# Patient Record
Sex: Male | Born: 1955
Health system: Southern US, Community
[De-identification: ages and names within clinical notes are randomized; demographics above are authoritative.]

## PROBLEM LIST (undated history)

## (undated) DIAGNOSIS — Z8619 Personal history of other infectious and parasitic diseases: Secondary | ICD-10-CM

## (undated) DIAGNOSIS — M2550 Pain in unspecified joint: Secondary | ICD-10-CM

## (undated) DIAGNOSIS — M1990 Unspecified osteoarthritis, unspecified site: Secondary | ICD-10-CM

## (undated) DIAGNOSIS — M549 Dorsalgia, unspecified: Secondary | ICD-10-CM

## (undated) DIAGNOSIS — I10 Essential (primary) hypertension: Secondary | ICD-10-CM

## (undated) DIAGNOSIS — R531 Weakness: Secondary | ICD-10-CM

## (undated) DIAGNOSIS — E785 Hyperlipidemia, unspecified: Secondary | ICD-10-CM

## (undated) DIAGNOSIS — Z8719 Personal history of other diseases of the digestive system: Secondary | ICD-10-CM

## (undated) HISTORY — PX: ELBOW SURGERY: SHX618

## (undated) HISTORY — PX: SHOULDER SURGERY: SHX246

## (undated) HISTORY — PX: TONSILLECTOMY: SUR1361

## (undated) HISTORY — PX: ADENOIDECTOMY: SUR15

## (undated) HISTORY — PX: ARTHROSCOPIC REPAIR ACL: SUR80

## (undated) HISTORY — PX: COLONOSCOPY: SHX174

---

## 1997-09-23 ENCOUNTER — Ambulatory Visit (HOSPITAL_BASED_OUTPATIENT_CLINIC_OR_DEPARTMENT_OTHER): Admission: RE | Admit: 1997-09-23 | Discharge: 1997-09-23 | Payer: Self-pay | Admitting: Orthopaedic Surgery

## 1997-12-12 ENCOUNTER — Emergency Department (HOSPITAL_COMMUNITY): Admission: EM | Admit: 1997-12-12 | Discharge: 1997-12-12 | Payer: Self-pay

## 2003-05-13 ENCOUNTER — Encounter: Admission: RE | Admit: 2003-05-13 | Discharge: 2003-05-13 | Payer: Self-pay | Admitting: Diagnostic Radiology

## 2003-07-22 ENCOUNTER — Encounter: Admission: RE | Admit: 2003-07-22 | Discharge: 2003-07-22 | Payer: Self-pay | Admitting: Diagnostic Radiology

## 2004-06-04 ENCOUNTER — Encounter: Admission: RE | Admit: 2004-06-04 | Discharge: 2004-06-04 | Payer: Self-pay | Admitting: Internal Medicine

## 2004-07-03 ENCOUNTER — Encounter: Admission: RE | Admit: 2004-07-03 | Discharge: 2004-07-03 | Payer: Self-pay | Admitting: Internal Medicine

## 2005-01-23 ENCOUNTER — Encounter: Admission: RE | Admit: 2005-01-23 | Discharge: 2005-01-23 | Payer: Self-pay | Admitting: Sports Medicine

## 2005-08-19 ENCOUNTER — Ambulatory Visit (HOSPITAL_BASED_OUTPATIENT_CLINIC_OR_DEPARTMENT_OTHER): Admission: RE | Admit: 2005-08-19 | Discharge: 2005-08-19 | Payer: Self-pay | Admitting: Family Medicine

## 2005-08-20 ENCOUNTER — Ambulatory Visit: Payer: Self-pay | Admitting: Internal Medicine

## 2007-12-31 ENCOUNTER — Emergency Department (HOSPITAL_BASED_OUTPATIENT_CLINIC_OR_DEPARTMENT_OTHER): Admission: EM | Admit: 2007-12-31 | Discharge: 2007-12-31 | Payer: Self-pay | Admitting: Emergency Medicine

## 2010-04-16 ENCOUNTER — Encounter: Payer: Self-pay | Admitting: Internal Medicine

## 2010-08-11 NOTE — Procedures (Signed)
Johnny Gonzalez, HECKMANN NO.:  [id number]   MEDICAL RECORD NO.:  [id number]          PATIENT TYPE:  OUT   LOCATION:  SLEEP CENTER                 FACILITY:  [facility]   PHYSICIAN:  Clinton D. Maple Hudson, M.D. DATE OF BIRTH:  [birth year]   DATE OF STUDY:                              NOCTURNAL POLYSOMNOGRAM   REFERRING PHYSICIAN:  Dr. Bradd Canary   DATE OF STUDY:  Aug 19, 2005   INDICATION FOR STUDY:  Insomnia with sleep apnea   EPWORTH SLEEPINESS SCORE:  1/24.  BMI 31.  Weight 235 pounds.   MEDICATIONS:  Lorazepam.   SLEEP ARCHITECTURE:  Total sleep time 347 minutes with sleep efficiency 81%.  Stage I was 6%, stage II 70%, stages III and IV were absent, REM 23% of  total sleep time.  Sleep latency 56 minutes.  REM latency 86 minutes.  Awake  after sleep onset 24 minutes.  Arousal index 24.  No bedtime medication was  reported.   RESPIRATORY DATA:  Apnea/hypopnea index (AHI, RDI) 13.1 obstructive events  per hour indicating mild obstructive sleep apnea/hypopnea syndrome.  This  included 76 hypopneas.  Events were not positional.  REM AHI 19.6 per hour.  There were insufficient events and sleep to permit use of CPAP titration by  split protocol on the study night.   OXYGEN DATA:  Moderately loud snoring with oxygen saturation nadir 88%.  Mean oxygen saturation through the study 95% on room air.   CARDIAC DATA:  Normal sinus rhythm with occasional PVC.   MOVEMENT-PARASOMNIA:  A total of 214 limb jerks of which 13 were associated  with arousal or awakening for a periodic limb movement with arousal index of  2.2 per hour which is mildly increased.   IMPRESSIONS-RECOMMENDATIONS:  1.  Sleep architecture was not remarkable for sleep center environment and      did not appear to reflect a significant insomnia pattern on the study      night despite apparent absence of sedative hypnotic medication.  2.  Mild obstructive sleep apnea/hypopnea syndrome with non-positional  events limited to hypopneas.  Moderately loud snoring with oxygen      desaturation to a nadir of 88%.  3.  Obstructive apnea with scores in this range is borderline for treatment      with CPAP.  It may be covered if more conservative measures such as      weight loss and sleeping on flat of back, as well as treatment for any      significant      nasal congestion, are insufficient or unsatisfactory.  4.  Mild periodic limb movement with arousal, 2.2 per hour.      Clinton D. Maple Hudson, M.D.  Diplomate, Biomedical engineer of Sleep Medicine  Electronically Signed     CDY/MEDQ  D:  08/20/2005 14:17:31  T:  08/20/2005 15:14:19  Job:  [id number]

## 2013-03-16 ENCOUNTER — Other Ambulatory Visit: Payer: Self-pay | Admitting: Orthopedic Surgery

## 2013-03-16 NOTE — Progress Notes (Signed)
Preoperative surgical orders have been place into the Epic hospital system for Johnny Gonzalez on 03/16/2013, 8:25 AM  by Patrica Duel for surgery on 04-01-2013.  Preop Total Hip - Anterior Approach orders including Experel Injecion, PO Tylenol, and IV Decadron as long as there are no contraindications to the above medications. Avel Peace, PA-C

## 2013-03-17 ENCOUNTER — Encounter (HOSPITAL_COMMUNITY): Payer: Self-pay | Admitting: Pharmacy Technician

## 2013-03-23 ENCOUNTER — Other Ambulatory Visit (HOSPITAL_COMMUNITY): Payer: Self-pay | Admitting: Orthopedic Surgery

## 2013-03-23 NOTE — Patient Instructions (Signed)
20 PHILEMON RIEDESEL  03/23/2013   Your procedure is scheduled on: Wednesday January 7th 2015  Report to [facility] at 1100 AM.  Call this number if you have problems the morning of surgery [phone]   Remember:   Do not eat food :After Midnight.   clear  Liquids midnight until 730 am day of surgery, then nothing by mouth   Take these medicines the morning of surgery with A SIP OF WATER:                                 SEE [place] PREPARING FOR SURGERY SHEET             You may not have any metal on your body including hair pins and piercings  Do not wear jewelry, make-up.  Do not wear lotions, powders, or perfumes. You may wear deodorant.   Men may shave face and neck.  Do not bring valuables to the hospital. [facility] IS NOT RESPONSIBLE FOR VALUEABLES.  Contacts, dentures or bridgework may not be worn into surgery.  Leave suitcase in the car. After surgery it may be brought to your room.  For patients admitted to the hospital, checkout time is 11:00 AM the day of discharge.     Please read over the following fact sheets that you were given: [facility] information, blood sheet, [place] preparing for surgery sheet , incentive spirometer sheet, clear liquid sheet   Call  Theodis Aguas  RN pre op nurse if needed [phone]    FAILURE TO FOLLOW THESE INSTRUCTIONS MAY RESULT IN THE CANCELLATION OF YOUR SURGERY.  PATIENT SIGNATURE___________________________________________  NURSE SIGNATURE_____________________________________________

## 2013-03-23 NOTE — Progress Notes (Signed)
ekg dated 12-4 14 dr Vernell Morgans on chart Medical clearance note  02-26-13 dr Vernell Morgans on chart

## 2013-03-24 ENCOUNTER — Inpatient Hospital Stay (HOSPITAL_COMMUNITY)
Admission: RE | Admit: 2013-03-24 | Discharge: 2013-03-24 | Disposition: A | Discharge status: Home / Self Care | Payer: Self-pay | Source: Ambulatory Visit

## 2013-04-01 ENCOUNTER — Encounter (HOSPITAL_COMMUNITY): Admission: RE | Payer: Self-pay | Source: Ambulatory Visit

## 2013-04-01 ENCOUNTER — Inpatient Hospital Stay (HOSPITAL_COMMUNITY): Admission: RE | Admit: 2013-04-01 | Payer: Self-pay | Source: Ambulatory Visit | Admitting: Orthopedic Surgery

## 2013-04-01 SURGERY — ARTHROPLASTY, HIP, TOTAL, ANTERIOR APPROACH
Anesthesia: Choice | Site: Hip | Laterality: Left

## 2013-04-07 ENCOUNTER — Encounter (HOSPITAL_COMMUNITY): Payer: Self-pay | Admitting: Emergency Medicine

## 2013-04-07 ENCOUNTER — Emergency Department (HOSPITAL_COMMUNITY)
Admission: EM | Admit: 2013-04-07 | Discharge: 2013-04-07 | Disposition: A | Discharge status: Home / Self Care | Payer: No Typology Code available for payment source | Source: Home / Self Care | Attending: Family Medicine | Admitting: Family Medicine

## 2013-04-07 DIAGNOSIS — M25512 Pain in left shoulder: Secondary | ICD-10-CM

## 2013-04-07 DIAGNOSIS — M25519 Pain in unspecified shoulder: Secondary | ICD-10-CM

## 2013-04-07 MED ORDER — TRIAMCINOLONE ACETONIDE 40 MG/ML IJ SUSP
INTRAMUSCULAR | Status: AC
  Filled 2013-04-07: qty 1

## 2013-04-07 NOTE — ED Notes (Signed)
Dr. Artis FlockKindl has already seen pt Pt c/o left shoulder pain onset 3 weeks that increases w/activity Hx of left shoulder surgery... Denies recent inj/trauma Alert w/no signs of acute distress.

## 2013-04-07 NOTE — ED Provider Notes (Signed)
CSN: [id number]     Arrival date & time 04/07/13  0800 History   First MD Initiated Contact with Patient 04/07/13 [phone]     Chief Complaint  Patient presents with  . Shoulder Pain   (Consider location/radiation/quality/duration/timing/severity/associated sxs/prior Treatment) Patient is a 58 y.o. male presenting with shoulder pain. The history is provided by the patient.  Shoulder Pain This is a chronic problem. The current episode started more than 2 days ago. The problem has been gradually worsening. Associated symptoms comments: Deg changes to bilat shoulders, L>R, increasing pain and limitation of rom, requesting inj for temp relief until surgery .Marland Kitchen.    History reviewed. No pertinent past medical history. Past Surgical History  Procedure Laterality Date  . Brain surgery     No family history on file. History  Substance Use Topics  . Smoking status: Never Smoker   . Smokeless tobacco: Not on file  . Alcohol Use: No    Review of Systems  Musculoskeletal: Positive for arthralgias and joint swelling.    Allergies  Review of patient's allergies indicates no known allergies.  Home Medications   Current Outpatient Rx  Name  Route  Sig  Dispense  Refill  . valsartan-hydrochlorothiazide (DIOVAN-HCT) 160-25 MG per tablet   Oral   Take 1 tablet by mouth daily with breakfast.         . CALCIUM PO   Oral   Take 1 tablet by mouth daily.         Chilton Si. Green Tea, Camillia sinensis, (GREEN TEA PO)   Oral   Take 1 tablet by mouth daily.         . Naproxen Sodium (ALEVE PO)   Oral   Take 2 tablets by mouth daily as needed (pain).          BP 133/88  Pulse 72  Temp(Src) 97.3 F (36.3 C) (Oral)  Resp 18  SpO2 98% Physical Exam  Nursing note and vitals reviewed. Constitutional: He is oriented to person, place, and time. He appears well-developed and well-nourished.  Musculoskeletal: He exhibits tenderness.       Left shoulder: He exhibits decreased range of motion,  tenderness, bony tenderness, crepitus, pain, abnormal pulse and decreased strength.  Neurological: He is alert and oriented to person, place, and time.  Skin: Skin is warm and dry.    ED Course  ARTHOCENTESIS Date/Time: 04/07/2013 8:42 AM Performed by: Linna HoffKINDL, Shields Pautz D Authorized by: Bradd CanaryKINDL, Crysta Gulick D Consent: Verbal consent obtained. Risks and benefits: risks, benefits and alternatives were discussed Consent given by: patient Indications: pain  Body area: shoulder Joint: left shoulder Local anesthesia used: no Patient sedated: no Needle gauge: 22 G Approach: lateral Bupivacaine 0.50% amount: 5 ml Hydrocortisone amount: 40 mg Patient tolerance: Patient tolerated the procedure well with no immediate complications. Comments: Marked improvement felt after inj.   (including critical care time) Labs Review Labs Reviewed - No data to display Imaging Review No results found.  EKG Interpretation    Date/Time:    Ventricular Rate:    PR Interval:    QRS Duration:   QT Interval:    QTC Calculation:   R Axis:     Text Interpretation:              MDM   1. Shoulder joint pain, left        Linna HoffJames D Richa Shor, MD 04/07/13 [phone]

## 2013-06-02 ENCOUNTER — Emergency Department (HOSPITAL_COMMUNITY)
Admission: EM | Admit: 2013-06-02 | Discharge: 2013-06-02 | Disposition: A | Discharge status: Home / Self Care | Payer: No Typology Code available for payment source | Source: Home / Self Care | Attending: Family Medicine | Admitting: Family Medicine

## 2013-06-02 ENCOUNTER — Encounter (HOSPITAL_COMMUNITY): Payer: Self-pay | Admitting: Emergency Medicine

## 2013-06-02 DIAGNOSIS — M7500 Adhesive capsulitis of unspecified shoulder: Secondary | ICD-10-CM

## 2013-06-02 DIAGNOSIS — M7501 Adhesive capsulitis of right shoulder: Secondary | ICD-10-CM

## 2013-06-02 MED ORDER — TRIAMCINOLONE ACETONIDE 40 MG/ML IJ SUSP
80.0000 mg | Freq: Once | INTRAMUSCULAR | Status: AC
  Administered 2013-06-02: 80 mg via INTRA_ARTICULAR

## 2013-06-02 MED ORDER — DICLOFENAC 35 MG PO CAPS: 35.0000 mg | ORAL_CAPSULE | Freq: Three times a day (TID) | ORAL | Status: DC

## 2013-06-02 MED ORDER — TRIAMCINOLONE ACETONIDE 40 MG/ML IJ SUSP
INTRAMUSCULAR | Status: AC
  Filled 2013-06-02: qty 2

## 2013-06-02 NOTE — Discharge Instructions (Signed)
Ice and medicine as needed, return as needed

## 2013-06-02 NOTE — ED Notes (Signed)
80  Mg  Kenalog  Given to  Dr Artis Flockkindl     For  Injection   Mixed  With 3ml  Marcaine

## 2013-06-02 NOTE — ED Notes (Signed)
Shoulder  Pain   Has had  Before         Pt  denys  Any  Recent injury     -  Pt  Ambulated to  Room  With a  Steady  Fluid  Gait

## 2013-06-02 NOTE — ED Provider Notes (Signed)
CSN: [id number]     Arrival date & time 06/02/13  0801 History   First MD Initiated Contact with Patient 06/02/13 0830     Chief Complaint  Patient presents with  . Shoulder Pain   (Consider location/radiation/quality/duration/timing/severity/associated sxs/prior Treatment) Patient is a 58 y.o. male presenting with shoulder pain. The history is provided by the patient.  Shoulder Pain This is a chronic problem. The current episode started 2 days ago (desires inj until surgery with dr Thurston Holewainer, having problems with both shoulders, injection helped left.). The problem has been gradually worsening. Pertinent negatives include no chest pain and no abdominal pain.    History reviewed. No pertinent past medical history. Past Surgical History  Procedure Laterality Date  . Brain surgery     History reviewed. No pertinent family history. History  Substance Use Topics  . Smoking status: Never Smoker   . Smokeless tobacco: Not on file  . Alcohol Use: No    Review of Systems  Constitutional: Negative.   Cardiovascular: Negative for chest pain.  Gastrointestinal: Negative for abdominal pain.  Musculoskeletal: Positive for joint swelling.    Allergies  Review of patient's allergies indicates no known allergies.  Home Medications   Current Outpatient Rx  Name  Route  Sig  Dispense  Refill  . CALCIUM PO   Oral   Take 1 tablet by mouth daily.         . Diclofenac (ZORVOLEX) 35 MG CAPS   Oral   Take 35 mg by mouth 3 (three) times daily after meals.   90 capsule   1   . Green Tea, Camillia sinensis, (GREEN TEA PO)   Oral   Take 1 tablet by mouth daily.         . Naproxen Sodium (ALEVE PO)   Oral   Take 2 tablets by mouth daily as needed (pain).         . valsartan-hydrochlorothiazide (DIOVAN-HCT) 160-25 MG per tablet   Oral   Take 1 tablet by mouth daily with breakfast.          BP 130/90  Pulse 88  Temp(Src) 98.6 F (37 C) (Oral)  Resp 18  SpO2 100% Physical  Exam  Nursing note and vitals reviewed. Constitutional: He is oriented to person, place, and time. He appears well-developed and well-nourished.  Musculoskeletal: He exhibits tenderness.       Right shoulder: He exhibits decreased range of motion, bony tenderness, swelling, crepitus, pain and decreased strength. He exhibits no deformity and normal pulse.  Neurological: He is alert and oriented to person, place, and time.  Skin: Skin is warm and dry.    ED Course  ARTHOCENTESIS Date/Time: 06/02/2013 8:45 AM Performed by: Linna HoffKINDL, Teairra Millar D Authorized by: Bradd CanaryKINDL, Tahsin Benyo D Consent: Verbal consent obtained. Risks and benefits: risks, benefits and alternatives were discussed Consent given by: patient Indications: pain  Body area: shoulder Joint: right shoulder Local anesthesia used: no Patient sedated: no Preparation: Patient was prepped and draped in the usual sterile fashion. Needle gauge: 22 G Ultrasound guidance: no Approach: lateral Triamcinolone amount: 80 mg Bupivacaine 0.50% amount: 3 ml Patient tolerance: Patient tolerated the procedure well with no immediate complications.   (including critical care time) Labs Review Labs Reviewed - No data to display Imaging Review No results found.   MDM   1. Bursitis of shoulder, right, adhesive        Linna HoffJames D Damyen Knoll, MD 06/02/13 [phone]

## 2013-08-28 ENCOUNTER — Other Ambulatory Visit: Payer: Self-pay | Admitting: Orthopedic Surgery

## 2013-09-04 ENCOUNTER — Encounter (HOSPITAL_COMMUNITY): Payer: Self-pay | Admitting: Pharmacy Technician

## 2013-09-08 ENCOUNTER — Encounter (HOSPITAL_COMMUNITY): Payer: Self-pay

## 2013-09-08 ENCOUNTER — Other Ambulatory Visit: Payer: Self-pay

## 2013-09-08 ENCOUNTER — Encounter (HOSPITAL_COMMUNITY)
Admission: RE | Admit: 2013-09-08 | Discharge: 2013-09-08 | Disposition: A | Discharge status: Home / Self Care | Payer: No Typology Code available for payment source | Source: Ambulatory Visit | Attending: Orthopedic Surgery | Admitting: Orthopedic Surgery

## 2013-09-08 DIAGNOSIS — Z01812 Encounter for preprocedural laboratory examination: Secondary | ICD-10-CM | POA: Insufficient documentation

## 2013-09-08 DIAGNOSIS — Z01818 Encounter for other preprocedural examination: Secondary | ICD-10-CM | POA: Diagnosis not present

## 2013-09-08 HISTORY — DX: Weakness: R53.1

## 2013-09-08 HISTORY — DX: Personal history of other diseases of the digestive system: Z87.19

## 2013-09-08 HISTORY — DX: Unspecified osteoarthritis, unspecified site: M19.90

## 2013-09-08 HISTORY — DX: Personal history of other infectious and parasitic diseases: Z86.19

## 2013-09-08 HISTORY — DX: Pain in unspecified joint: M25.50

## 2013-09-08 HISTORY — DX: Dorsalgia, unspecified: M54.9

## 2013-09-08 HISTORY — DX: Essential (primary) hypertension: I10

## 2013-09-08 HISTORY — DX: Hyperlipidemia, unspecified: E78.5

## 2013-09-08 LAB — CBC
HCT: 39.1 % (ref 39.0–52.0)
Hemoglobin: 12.8 g/dL — ABNORMAL LOW (ref 13.0–17.0)
MCH: 28.5 pg (ref 26.0–34.0)
MCHC: 32.7 g/dL (ref 30.0–36.0)
MCV: 87.1 fL (ref 78.0–100.0)
PLATELETS: 207 10*3/uL (ref 150–400)
RBC: 4.49 MIL/uL (ref 4.22–5.81)
RDW: 13.6 % (ref 11.5–15.5)
WBC: 7.1 10*3/uL (ref 4.0–10.5)

## 2013-09-08 LAB — COMPREHENSIVE METABOLIC PANEL
ALK PHOS: 78 U/L (ref 39–117)
ALT: 26 U/L (ref 0–53)
AST: 22 U/L (ref 0–37)
Albumin: 4.1 g/dL (ref 3.5–5.2)
BILIRUBIN TOTAL: 0.5 mg/dL (ref 0.3–1.2)
BUN: 18 mg/dL (ref 6–23)
CHLORIDE: 99 meq/L (ref 96–112)
CO2: 32 mEq/L (ref 19–32)
Calcium: 9.1 mg/dL (ref 8.4–10.5)
Creatinine, Ser: 1.26 mg/dL (ref 0.50–1.35)
GFR calc Af Amer: 70 mL/min — ABNORMAL LOW (ref >90)
GFR calc non Af Amer: 61 mL/min — ABNORMAL LOW (ref >90)
Glucose, Bld: 93 mg/dL (ref 70–99)
POTASSIUM: 3.7 meq/L (ref 3.7–5.3)
Sodium: 142 mEq/L (ref 137–147)
Total Protein: 7.3 g/dL (ref 6.0–8.3)

## 2013-09-08 LAB — URINALYSIS, ROUTINE W REFLEX MICROSCOPIC
Bilirubin Urine: NEGATIVE
Glucose, UA: NEGATIVE mg/dL
Hgb urine dipstick: NEGATIVE
Ketones, ur: 15 mg/dL — AB
LEUKOCYTES UA: NEGATIVE
NITRITE: NEGATIVE
PH: 6 (ref 5.0–8.0)
Protein, ur: NEGATIVE mg/dL
SPECIFIC GRAVITY, URINE: 1.027 (ref 1.005–1.030)
Urobilinogen, UA: 0.2 mg/dL (ref 0.0–1.0)

## 2013-09-08 LAB — TYPE AND SCREEN
ABO/RH(D): O POS
Antibody Screen: NEGATIVE

## 2013-09-08 LAB — PROTIME-INR
INR: 0.99 (ref 0.00–1.49)
Prothrombin Time: 12.9 seconds (ref 11.6–15.2)

## 2013-09-08 LAB — SURGICAL PCR SCREEN
MRSA, PCR: NEGATIVE
Staphylococcus aureus: NEGATIVE

## 2013-09-08 LAB — APTT: aPTT: 35 seconds (ref 24–37)

## 2013-09-08 LAB — ABO/RH: ABO/RH(D): O POS

## 2013-09-08 MED ORDER — CHLORHEXIDINE GLUCONATE 4 % EX LIQD: 60.0000 mL | Freq: Once | CUTANEOUS | Status: DC

## 2013-09-08 NOTE — Progress Notes (Signed)
09/08/13 1345  OBSTRUCTIVE SLEEP APNEA  Have you ever been diagnosed with sleep apnea through a sleep study? No  Do you snore loudly (loud enough to be heard through closed doors)?  0  Do you often feel tired, fatigued, or sleepy during the daytime? 0  Has anyone observed you stop breathing during your sleep? 0  Do you have, or are you being treated for high blood pressure? 1  BMI more than 35 kg/m2? 0  Age over 59 years old? 1  Neck circumference greater than 40 cm/16 inches? 1  Gender: 1  Obstructive Sleep Apnea Score 4  Score 4 or greater  Results sent to PCP

## 2013-09-08 NOTE — Pre-Procedure Instructions (Signed)
Lauralyn PrimesStephen T Novitski  09/08/2013   Your procedure is scheduled on:  Fri, June 26 @ 9:30 AM  [facility].  Call this number if you have problems the morning of surgery: [phone]   Remember:   Do not eat food or drink liquids after midnight.                 Stop taking your Aleve. No Goody's,BC's,Aspirin,Ibuprofen,Fish Oil,or any Herbal Medications   Do not wear jewelry  Do not wear lotions, powders, or colognes. You may wear deodorant.  Men may shave face and neck.  Do not bring valuables to the hospital.  [facility] is not responsible                  for any belongings or valuables.               Contacts, dentures or bridgework may not be worn into surgery.  Leave suitcase in the car. After surgery it may be brought to your room.  For patients admitted to the hospital, discharge time is determined by your                treatment team.                 Special Instructions:  [place] - Preparing for Surgery  Before surgery, you can play an important role.  Because skin is not sterile, your skin needs to be as free of germs as possible.  You can reduce the number of germs on you skin by washing with CHG (chlorahexidine gluconate) soap before surgery.  CHG is an antiseptic cleaner which kills germs and bonds with the skin to continue killing germs even after washing.  Please DO NOT use if you have an allergy to CHG or antibacterial soaps.  If your skin becomes reddened/irritated stop using the CHG and inform your nurse when you arrive at Short Stay.  Do not shave (including legs and underarms) for at least 48 hours prior to the first CHG shower.  You may shave your face.  Please follow these instructions carefully:   1.  Shower with CHG Soap the night before surgery and the                                morning of Surgery.  2.  If you choose to wash your hair, wash your hair first as usual with your       normal shampoo.  3.  After you shampoo,  rinse your hair and body thoroughly to remove the                      Shampoo.  4.  Use CHG as you would any other liquid soap.  You can apply chg directly       to the skin and wash gently with scrungie or a clean washcloth.  5.  Apply the CHG Soap to your body ONLY FROM THE NECK DOWN.        Do not use on open wounds or open sores.  Avoid contact with your eyes,       ears, mouth and genitals (private parts).  Wash genitals (private parts)       with your normal soap.  6.  Wash thoroughly, paying special attention to the area where your surgery  will be performed.  7.  Thoroughly rinse your body with warm water from the neck down.  8.  DO NOT shower/wash with your normal soap after using and rinsing off       the CHG Soap.  9.  Pat yourself dry with a clean towel.            10.  Wear clean pajamas.            11.  Place clean sheets on your bed the night of your first shower and do not        sleep with pets.  Day of Surgery  Do not apply any lotions/deoderants the morning of surgery.  Please wear clean clothes to the hospital/surgery center.     Please read over the following fact sheets that you were given: Pain Booklet, Coughing and Deep Breathing, Blood Transfusion Information, MRSA Information and Surgical Site Infection Prevention

## 2013-09-08 NOTE — Progress Notes (Addendum)
Pt doesn't have a cardiologist  Denies ever having an echo/stress test/heart cath    Sleep study in epic from 2007-but confirmed not to have   Denies EKG or CXR in past yr  Medical Md is Dr. Albertina SenegalNelson Pollock

## 2013-09-17 ENCOUNTER — Other Ambulatory Visit: Payer: Self-pay | Admitting: Orthopedic Surgery

## 2013-09-17 MED ORDER — ACETAMINOPHEN 10 MG/ML IV SOLN
1000.0000 mg | Freq: Once | INTRAVENOUS | Status: AC
  Administered 2013-09-18: 1000 mg via INTRAVENOUS
  Filled 2013-09-17: qty 100

## 2013-09-17 MED ORDER — CEFAZOLIN SODIUM-DEXTROSE 2-3 GM-% IV SOLR
2.0000 g | INTRAVENOUS | Status: DC
  Filled 2013-09-17: qty 50

## 2013-09-17 MED ORDER — SODIUM CHLORIDE 0.9 % IV SOLN
INTRAVENOUS | Status: DC
Start: 2013-09-17 — End: 2013-09-18
  Administered 2013-09-18: 09:00:00 via INTRAVENOUS

## 2013-09-17 MED ORDER — DEXAMETHASONE SODIUM PHOSPHATE 10 MG/ML IJ SOLN
10.0000 mg | Freq: Once | INTRAMUSCULAR | Status: AC
  Administered 2013-09-18: 10 mg via INTRAVENOUS
  Filled 2013-09-17: qty 1

## 2013-09-17 MED ORDER — BUPIVACAINE LIPOSOME 1.3 % IJ SUSP
20.0000 mL | Freq: Once | INTRAMUSCULAR | Status: DC
  Filled 2013-09-17: qty 20

## 2013-09-17 NOTE — H&P (Signed)
Johnny HahnStephen T. Gonzalez DOB: 24-Mar-1956 Married / Language: English / Race: White Male Date of Admission:  09-18-2013  Chief Complaint:  Left Hip Pain  History of Present Illness The patient is a 58 year old male who comes in for a preoperative History and Physical. The patient is scheduled for a left total hip arthroplasty (anterior approach) to be performed by Dr. Gus RankinFrank V. Aluisio, [facility] on 09-18-2013. The patient reports left hip problems including pain symptoms that have been present for 2 year(s) (approximately). The symptoms began following a specific injury (does note history of 2 falls of horse). Symptoms reported include: hip pain (that is more activity related, especially notices with first steps after sitting) and popping, while reported symptoms do not include night pain Prior to being seen today the patient was previously evaluated by a primary physician (and by Dr. Cardell PeachGay with [facility]). Previous workup for this problem has included hip x-rays. This problem has not been previously treated. He has had problems with it for about 5 years when is was agrrevatied after falling off a horse. It became flared up again about a year ago. he describes activity realted pain and his wife states that he has been limping more. The amount of pain is sometimes related to the level of activity during the day. He will experience a lot of stiffness if he rides in a car for any length of time and has noticed that is has become more difficult with getting his shoes and socks on the left leg and foot. He denies night pain. There is no numbness but he has noticed some timgling in the left thigh down to the knee at times. He has taken Aleve and Tylenol which will give him some mild relief. He has stopped riding horses afraid that he might fall again and has stopped palying basketball. It has also started limiting his time at the gym. The pain is now bothering him at all times. The pain  is in the buttock, lower back, and does radiate to the groin. Occasionally it comes down the thigh, but generally stays proximal. He has had more functional limitations and limitations in motion. He is at a stage now where the hip is preventing him from doing things he desires. Even when he does things, it is getting more difficult for him to do so.  He is ready to get the hip fixed. They have been treated conservatively in the past for the above stated problem and despite conservative measures, they continue to have progressive pain and severe functional limitations and dysfunction. They have failed non-operative management including home exercise, medications, and injections. It is felt that they would benefit from undergoing total joint replacement. Risks and benefits of the procedure have been discussed with the patient and they elect to proceed with surgery. There are no active contraindications to surgery such as ongoing infection or rapidly progressive neurological disease.   Allergies No Known Drug Allergies   Problem List/Past Medical Hip osteoarthritis (715.95  M16.9) High blood pressure Shingles Hiatal Hernia   Family History Hypertension. First Degree Relatives. mother and father   Social History Tobacco use. Former smoker. former smoker; smoke(d) less than 1/2 pack(s) per day Pain Contract. no Marital status. married Number of flights of stairs before winded. 4-5 Alcohol use. current drinker; drinks beer; only occasionally per week Children. 1 Current work status. working full time [employer]. Living Will Illicit drug use. no Drug/Alcohol Rehab (Previously). no Tobacco / smoke exposure.  no Drug/Alcohol Rehab (Currently). no Living situation. live with spouse Exercise. Exercises monthly; does individual sport    Medication History Valsartan-Hydrochlorothiazide (160-25MG  Tablet, Oral) Active.    Past Surgical  History Arthroscopy of Knee. left Arthroscopy of Shoulder. left Other Orthopaedic Surgery Left ACL Reconstruction. Date: 1999.   Review of Systems General:Not Present- Chills, Fever, Night Sweats, Fatigue, Weight Gain, Weight Loss and Memory Loss. Skin:Not Present- Hives, Itching, Rash, Eczema and Lesions. HEENT:Not Present- Tinnitus, Headache, Double Vision, Visual Loss, Hearing Loss and Dentures. Respiratory:Not Present- Shortness of breath with exertion, Shortness of breath at rest, Allergies, Coughing up blood and Chronic Cough. Cardiovascular:Not Present- Chest Pain, Racing/skipping heartbeats, Difficulty Breathing Lying Down, Murmur, Swelling and Palpitations. Gastrointestinal:Not Present- Bloody Stool, Heartburn, Abdominal Pain, Vomiting, Nausea, Constipation, Diarrhea, Difficulty Swallowing, Jaundice and Loss of appetitie. Male Genitourinary:Not Present- Urinary frequency, Blood in Urine, Weak urinary stream, Discharge, Flank Pain, Incontinence, Painful Urination, Urgency, Urinary Retention and Urinating at Night. Musculoskeletal:Present- Back Pain and Morning Stiffness. Not Present- Muscle Weakness, Muscle Pain, Joint Swelling, Joint Pain and Spasms. Neurological:Not Present- Tremor, Dizziness, Blackout spells, Paralysis, Difficulty with balance and Weakness. Psychiatric:Not Present- Insomnia.    Vitals Weight: 235 lb Height: 74 in Weight was reported by patient. Height was reported by patient. Body Surface Area: 2.36 m Body Mass Index: 30.17 kg/m BP: 114/72 (Sitting, Right Arm, Standard)   Physical Exam The physical exam findings are as follows:  Note: Patient is a 58 year old male with continued hip pain. Patient is accompanied today by his wife Porfirio MylarCarmen.   General Mental Status - Alert, cooperative and good historian. General Appearance- pleasant. Not in acute distress. Orientation- Oriented X3. Build & Nutrition- Well nourished and Well  developed.   Head and Neck Head- normocephalic, atraumatic . Neck Global Assessment- supple. no bruit auscultated on the right and no bruit auscultated on the left.   Eye Pupil- Bilateral- Regular and Round. Motion- Bilateral- EOMI.   Chest and Lung Exam Auscultation: Breath sounds:- clear at anterior chest wall and - clear at posterior chest wall. Adventitious sounds:- No Adventitious sounds.   Cardiovascular Auscultation:Rhythm- Regular rate and rhythm. Heart Sounds- S1 WNL and S2 WNL. Murmurs & Other Heart Sounds:Auscultation of the heart reveals - No Murmurs.   Abdomen Palpation/Percussion:Tenderness- Abdomen is non-tender to palpation. Rigidity (guarding)- Abdomen is soft. Auscultation:Auscultation of the abdomen reveals - Bowel sounds normal.   Male Genitourinary Not done, not pertinent to present illness  Musculoskeletal Well developed male, alert and oriented in no apparent distress. Evaluation of his right hip, normal range of motion. No discomfort. Left hip flexion is 90. Minimal internal rotation to about 20, external rotation 20, abduction all with pain. Knee exam is normal in both lower extremities. Pulses, sensation and motor are intact.  RADIOGRAPHS: AP pelvis, lateral of the left hip shows superolateral bone on bone arthritis with superolateral acetabular osteophytes and inferior femoral neck osteophytes.   Assessment & Plan Hip osteoarthritis (715.95  M16.9) Impression: Left Hip  Note: Plan is for a Left Total Hip Replacement by Dr. Lequita HaltAluisio.  Plan is to go home.  PCP - Dr. Julius BowelsPollock - Patient has been seen preoperatively and felt to be stable for surgery.  The patient does not have any contraindications and will receive TXA (tranexamic acid) prior to surgery.  Signed electronically by Lauraine RinneAlexzandrew L Fed Ceci, III PA-C

## 2013-09-17 NOTE — Progress Notes (Signed)
Instructed patient to arrive at 0645 am 09/18/13.

## 2013-09-18 ENCOUNTER — Inpatient Hospital Stay (HOSPITAL_COMMUNITY): Payer: No Typology Code available for payment source | Admitting: Anesthesiology

## 2013-09-18 ENCOUNTER — Encounter (HOSPITAL_COMMUNITY)
Admission: RE | Disposition: A | Discharge status: Home Health | Payer: Self-pay | Source: Ambulatory Visit | Attending: Orthopedic Surgery

## 2013-09-18 ENCOUNTER — Encounter (HOSPITAL_COMMUNITY): Payer: No Typology Code available for payment source | Admitting: Anesthesiology

## 2013-09-18 ENCOUNTER — Inpatient Hospital Stay (HOSPITAL_COMMUNITY): Payer: No Typology Code available for payment source

## 2013-09-18 ENCOUNTER — Encounter (HOSPITAL_COMMUNITY): Payer: Self-pay | Admitting: Anesthesiology

## 2013-09-18 ENCOUNTER — Inpatient Hospital Stay (HOSPITAL_COMMUNITY)
Admission: RE | Admit: 2013-09-18 | Discharge: 2013-09-19 | DRG: 470 | Disposition: A | Discharge status: Home Health | Payer: No Typology Code available for payment source | Source: Ambulatory Visit | Attending: Orthopedic Surgery | Admitting: Orthopedic Surgery

## 2013-09-18 DIAGNOSIS — M169 Osteoarthritis of hip, unspecified: Secondary | ICD-10-CM

## 2013-09-18 DIAGNOSIS — D62 Acute posthemorrhagic anemia: Secondary | ICD-10-CM | POA: Diagnosis not present

## 2013-09-18 DIAGNOSIS — M1612 Unilateral primary osteoarthritis, left hip: Secondary | ICD-10-CM

## 2013-09-18 DIAGNOSIS — M1610 Unilateral primary osteoarthritis, unspecified hip: Principal | ICD-10-CM | POA: Diagnosis present

## 2013-09-18 DIAGNOSIS — E785 Hyperlipidemia, unspecified: Secondary | ICD-10-CM | POA: Diagnosis present

## 2013-09-18 DIAGNOSIS — Z8619 Personal history of other infectious and parasitic diseases: Secondary | ICD-10-CM

## 2013-09-18 DIAGNOSIS — Z96642 Presence of left artificial hip joint: Secondary | ICD-10-CM

## 2013-09-18 DIAGNOSIS — I10 Essential (primary) hypertension: Secondary | ICD-10-CM | POA: Diagnosis present

## 2013-09-18 DIAGNOSIS — Z87891 Personal history of nicotine dependence: Secondary | ICD-10-CM

## 2013-09-18 DIAGNOSIS — K449 Diaphragmatic hernia without obstruction or gangrene: Secondary | ICD-10-CM | POA: Diagnosis present

## 2013-09-18 HISTORY — PX: TOTAL HIP ARTHROPLASTY: SHX124

## 2013-09-18 SURGERY — ARTHROPLASTY, HIP, TOTAL, ANTERIOR APPROACH
Anesthesia: General | Site: Hip | Laterality: Left

## 2013-09-18 MED ORDER — FENTANYL CITRATE 0.05 MG/ML IJ SOLN
INTRAMUSCULAR | Status: AC
  Filled 2013-09-18: qty 5

## 2013-09-18 MED ORDER — PHENOL 1.4 % MT LIQD: 1.0000 | OROMUCOSAL | Status: DC | PRN

## 2013-09-18 MED ORDER — KCL IN DEXTROSE-NACL 20-5-0.45 MEQ/L-%-% IV SOLN
INTRAVENOUS | Status: DC
  Administered 2013-09-18: 23:00:00 via INTRAVENOUS
  Filled 2013-09-18 (×4): qty 1000

## 2013-09-18 MED ORDER — PROMETHAZINE HCL 25 MG/ML IJ SOLN
6.2500 mg | INTRAMUSCULAR | Status: DC | PRN
  Administered 2013-09-18: 6.25 mg via INTRAVENOUS

## 2013-09-18 MED ORDER — MENTHOL 3 MG MT LOZG: 1.0000 | LOZENGE | OROMUCOSAL | Status: DC | PRN

## 2013-09-18 MED ORDER — GLYCOPYRROLATE 0.2 MG/ML IJ SOLN
INTRAMUSCULAR | Status: AC
  Filled 2013-09-18: qty 2

## 2013-09-18 MED ORDER — DEXAMETHASONE 4 MG PO TABS
10.0000 mg | ORAL_TABLET | Freq: Every day | ORAL | Status: AC
  Administered 2013-09-19: 10 mg via ORAL
  Filled 2013-09-18: qty 1

## 2013-09-18 MED ORDER — ACETAMINOPHEN 500 MG PO TABS
1000.0000 mg | ORAL_TABLET | Freq: Four times a day (QID) | ORAL | Status: AC
  Administered 2013-09-18 – 2013-09-19 (×4): 1000 mg via ORAL
  Filled 2013-09-18 (×4): qty 2

## 2013-09-18 MED ORDER — EPHEDRINE SULFATE 50 MG/ML IJ SOLN
INTRAMUSCULAR | Status: AC
  Filled 2013-09-18: qty 1

## 2013-09-18 MED ORDER — BUPIVACAINE HCL (PF) 0.25 % IJ SOLN
INTRAMUSCULAR | Status: DC | PRN
  Administered 2013-09-18: 20 mL

## 2013-09-18 MED ORDER — KETOROLAC TROMETHAMINE 15 MG/ML IJ SOLN: 7.5000 mg | Freq: Four times a day (QID) | INTRAMUSCULAR | Status: AC | PRN

## 2013-09-18 MED ORDER — RIVAROXABAN 10 MG PO TABS: 10.0000 mg | ORAL_TABLET | Freq: Every day | ORAL | Status: DC

## 2013-09-18 MED ORDER — MORPHINE SULFATE 2 MG/ML IJ SOLN: 1.0000 mg | INTRAMUSCULAR | Status: DC | PRN

## 2013-09-18 MED ORDER — ACETAMINOPHEN 650 MG RE SUPP: 650.0000 mg | Freq: Four times a day (QID) | RECTAL | Status: DC | PRN

## 2013-09-18 MED ORDER — RIVAROXABAN 10 MG PO TABS
10.0000 mg | ORAL_TABLET | Freq: Every day | ORAL | Status: DC
  Administered 2013-09-19: 10 mg via ORAL
  Filled 2013-09-18 (×2): qty 1

## 2013-09-18 MED ORDER — ACETAMINOPHEN 325 MG PO TABS: 650.0000 mg | ORAL_TABLET | Freq: Four times a day (QID) | ORAL | Status: DC | PRN

## 2013-09-18 MED ORDER — LACTATED RINGERS IV SOLN
INTRAVENOUS | Status: DC | PRN
  Administered 2013-09-18 (×2): via INTRAVENOUS

## 2013-09-18 MED ORDER — MIDAZOLAM HCL 5 MG/5ML IJ SOLN
INTRAMUSCULAR | Status: DC | PRN
  Administered 2013-09-18: 1 mg via INTRAVENOUS

## 2013-09-18 MED ORDER — VALSARTAN-HYDROCHLOROTHIAZIDE 160-25 MG PO TABS: 1.0000 | ORAL_TABLET | Freq: Every day | ORAL | Status: DC

## 2013-09-18 MED ORDER — IRBESARTAN 150 MG PO TABS
150.0000 mg | ORAL_TABLET | Freq: Every day | ORAL | Status: DC
  Administered 2013-09-18 – 2013-09-19 (×2): 150 mg via ORAL
  Filled 2013-09-18 (×2): qty 1

## 2013-09-18 MED ORDER — ROCURONIUM BROMIDE 100 MG/10ML IV SOLN
INTRAVENOUS | Status: DC | PRN
  Administered 2013-09-18: 50 mg via INTRAVENOUS

## 2013-09-18 MED ORDER — DOCUSATE SODIUM 100 MG PO CAPS
100.0000 mg | ORAL_CAPSULE | Freq: Two times a day (BID) | ORAL | Status: DC
  Administered 2013-09-18 – 2013-09-19 (×3): 100 mg via ORAL
  Filled 2013-09-18 (×3): qty 1

## 2013-09-18 MED ORDER — 0.9 % SODIUM CHLORIDE (POUR BTL) OPTIME
TOPICAL | Status: DC | PRN
  Administered 2013-09-18: 1000 mL

## 2013-09-18 MED ORDER — TRAMADOL HCL 50 MG PO TABS: 50.0000 mg | ORAL_TABLET | Freq: Four times a day (QID) | ORAL | Status: DC | PRN

## 2013-09-18 MED ORDER — ONDANSETRON HCL 4 MG PO TABS: 4.0000 mg | ORAL_TABLET | Freq: Four times a day (QID) | ORAL | Status: DC | PRN

## 2013-09-18 MED ORDER — FLEET ENEMA 7-19 GM/118ML RE ENEM: 1.0000 | ENEMA | Freq: Once | RECTAL | Status: AC | PRN

## 2013-09-18 MED ORDER — ONDANSETRON HCL 4 MG/2ML IJ SOLN: 4.0000 mg | Freq: Four times a day (QID) | INTRAMUSCULAR | Status: DC | PRN

## 2013-09-18 MED ORDER — FENTANYL CITRATE 0.05 MG/ML IJ SOLN
INTRAMUSCULAR | Status: AC
  Administered 2013-09-18: 50 ug
  Filled 2013-09-18: qty 2

## 2013-09-18 MED ORDER — METOCLOPRAMIDE HCL 10 MG PO TABS: 5.0000 mg | ORAL_TABLET | Freq: Three times a day (TID) | ORAL | Status: DC | PRN

## 2013-09-18 MED ORDER — NEOSTIGMINE METHYLSULFATE 10 MG/10ML IV SOLN
INTRAVENOUS | Status: DC | PRN
  Administered 2013-09-18: 3 mg via INTRAVENOUS

## 2013-09-18 MED ORDER — NEOSTIGMINE METHYLSULFATE 10 MG/10ML IV SOLN
INTRAVENOUS | Status: AC
  Filled 2013-09-18: qty 1

## 2013-09-18 MED ORDER — PROPOFOL 10 MG/ML IV BOLUS
INTRAVENOUS | Status: AC
  Filled 2013-09-18: qty 20

## 2013-09-18 MED ORDER — LIDOCAINE HCL (CARDIAC) 20 MG/ML IV SOLN
INTRAVENOUS | Status: DC | PRN
  Administered 2013-09-18: 80 mg via INTRAVENOUS

## 2013-09-18 MED ORDER — HYDROMORPHONE HCL PF 1 MG/ML IJ SOLN
INTRAMUSCULAR | Status: AC
  Administered 2013-09-18: 0.5 mg via INTRAVENOUS
  Filled 2013-09-18: qty 1

## 2013-09-18 MED ORDER — HYDROCHLOROTHIAZIDE 25 MG PO TABS
25.0000 mg | ORAL_TABLET | Freq: Every day | ORAL | Status: DC
  Administered 2013-09-18 – 2013-09-19 (×2): 25 mg via ORAL
  Filled 2013-09-18 (×2): qty 1

## 2013-09-18 MED ORDER — METHOCARBAMOL 500 MG PO TABS
500.0000 mg | ORAL_TABLET | Freq: Four times a day (QID) | ORAL | Status: DC | PRN
  Administered 2013-09-18 (×2): 500 mg via ORAL
  Filled 2013-09-18 (×2): qty 1

## 2013-09-18 MED ORDER — PROPOFOL 10 MG/ML IV BOLUS
INTRAVENOUS | Status: DC | PRN
  Administered 2013-09-18: 200 mg via INTRAVENOUS

## 2013-09-18 MED ORDER — METHOCARBAMOL 500 MG PO TABS: 500.0000 mg | ORAL_TABLET | Freq: Four times a day (QID) | ORAL | Status: DC | PRN

## 2013-09-18 MED ORDER — HYDROMORPHONE HCL PF 1 MG/ML IJ SOLN
0.2500 mg | INTRAMUSCULAR | Status: DC | PRN
  Administered 2013-09-18 (×2): 0.5 mg via INTRAVENOUS

## 2013-09-18 MED ORDER — ONDANSETRON HCL 4 MG/2ML IJ SOLN
INTRAMUSCULAR | Status: AC
  Filled 2013-09-18: qty 2

## 2013-09-18 MED ORDER — OXYCODONE HCL 5 MG PO TABS
5.0000 mg | ORAL_TABLET | ORAL | Status: DC | PRN
  Administered 2013-09-18 – 2013-09-19 (×6): 10 mg via ORAL
  Filled 2013-09-18 (×6): qty 2

## 2013-09-18 MED ORDER — METOCLOPRAMIDE HCL 5 MG/ML IJ SOLN: 5.0000 mg | Freq: Three times a day (TID) | INTRAMUSCULAR | Status: DC | PRN

## 2013-09-18 MED ORDER — OXYCODONE HCL 5 MG PO TABS: 5.0000 mg | ORAL_TABLET | ORAL | Status: DC | PRN

## 2013-09-18 MED ORDER — BUPIVACAINE LIPOSOME 1.3 % IJ SUSP
INTRAMUSCULAR | Status: DC | PRN
Start: 2013-09-18 — End: 2013-09-18
  Administered 2013-09-18: 20 mL

## 2013-09-18 MED ORDER — METHOCARBAMOL 1000 MG/10ML IJ SOLN
500.0000 mg | Freq: Four times a day (QID) | INTRAVENOUS | Status: DC | PRN
  Filled 2013-09-18: qty 5

## 2013-09-18 MED ORDER — SUCCINYLCHOLINE CHLORIDE 20 MG/ML IJ SOLN
INTRAMUSCULAR | Status: AC
  Filled 2013-09-18: qty 1

## 2013-09-18 MED ORDER — DIPHENHYDRAMINE HCL 12.5 MG/5ML PO ELIX: 12.5000 mg | ORAL_SOLUTION | ORAL | Status: DC | PRN

## 2013-09-18 MED ORDER — VANCOMYCIN HCL 10 G IV SOLR
1500.0000 mg | Freq: Once | INTRAVENOUS | Status: AC
  Administered 2013-09-18: 1500 mg via INTRAVENOUS
  Filled 2013-09-18 (×2): qty 1500

## 2013-09-18 MED ORDER — FENTANYL CITRATE 0.05 MG/ML IJ SOLN
INTRAMUSCULAR | Status: DC | PRN
  Administered 2013-09-18 (×2): 50 ug via INTRAVENOUS
  Administered 2013-09-18: 150 ug via INTRAVENOUS
  Administered 2013-09-18: 50 ug via INTRAVENOUS
  Administered 2013-09-18 (×2): 25 ug via INTRAVENOUS
  Administered 2013-09-18: 50 ug via INTRAVENOUS

## 2013-09-18 MED ORDER — SODIUM CHLORIDE 0.9 % IJ SOLN
INTRAMUSCULAR | Status: DC | PRN
  Administered 2013-09-18: 30 mL via INTRAVENOUS

## 2013-09-18 MED ORDER — LACTATED RINGERS IV SOLN
INTRAVENOUS | Status: DC
  Administered 2013-09-18: 07:00:00 via INTRAVENOUS

## 2013-09-18 MED ORDER — POLYETHYLENE GLYCOL 3350 17 G PO PACK: 17.0000 g | PACK | Freq: Every day | ORAL | Status: DC | PRN

## 2013-09-18 MED ORDER — PHENYLEPHRINE 40 MCG/ML (10ML) SYRINGE FOR IV PUSH (FOR BLOOD PRESSURE SUPPORT)
PREFILLED_SYRINGE | INTRAVENOUS | Status: AC
  Filled 2013-09-18: qty 10

## 2013-09-18 MED ORDER — LIDOCAINE HCL 4 % MT SOLN
OROMUCOSAL | Status: DC | PRN
  Administered 2013-09-18: 4 mL via TOPICAL

## 2013-09-18 MED ORDER — MIDAZOLAM HCL 2 MG/2ML IJ SOLN
INTRAMUSCULAR | Status: AC
  Filled 2013-09-18: qty 2

## 2013-09-18 MED ORDER — TRANEXAMIC ACID 100 MG/ML IV SOLN
1000.0000 mg | INTRAVENOUS | Status: AC
  Administered 2013-09-18: 1000 mg via INTRAVENOUS
  Filled 2013-09-18: qty 10

## 2013-09-18 MED ORDER — DEXAMETHASONE SODIUM PHOSPHATE 10 MG/ML IJ SOLN
10.0000 mg | Freq: Every day | INTRAMUSCULAR | Status: AC
  Filled 2013-09-18: qty 1

## 2013-09-18 MED ORDER — PROMETHAZINE HCL 25 MG/ML IJ SOLN
INTRAMUSCULAR | Status: AC
  Administered 2013-09-18: 6.25 mg via INTRAVENOUS
  Filled 2013-09-18: qty 1

## 2013-09-18 MED ORDER — BISACODYL 10 MG RE SUPP: 10.0000 mg | Freq: Every day | RECTAL | Status: DC | PRN

## 2013-09-18 MED ORDER — VANCOMYCIN HCL IN DEXTROSE 1-5 GM/200ML-% IV SOLN
1000.0000 mg | Freq: Two times a day (BID) | INTRAVENOUS | Status: AC
  Administered 2013-09-18: 1000 mg via INTRAVENOUS
  Filled 2013-09-18: qty 200

## 2013-09-18 MED ORDER — ONDANSETRON HCL 4 MG/2ML IJ SOLN
INTRAMUSCULAR | Status: DC | PRN
  Administered 2013-09-18: 4 mg via INTRAVENOUS

## 2013-09-18 MED ORDER — ONDANSETRON HCL 4 MG/2ML IJ SOLN
4.0000 mg | Freq: Once | INTRAMUSCULAR | Status: AC | PRN
  Administered 2013-09-18: 4 mg via INTRAVENOUS

## 2013-09-18 MED ORDER — GLYCOPYRROLATE 0.2 MG/ML IJ SOLN
INTRAMUSCULAR | Status: DC | PRN
  Administered 2013-09-18: 0.4 mg via INTRAVENOUS

## 2013-09-18 MED ORDER — BUPIVACAINE HCL (PF) 0.25 % IJ SOLN
INTRAMUSCULAR | Status: AC
  Filled 2013-09-18: qty 30

## 2013-09-18 MED ORDER — ARTIFICIAL TEARS OP OINT
TOPICAL_OINTMENT | OPHTHALMIC | Status: AC
  Filled 2013-09-18: qty 3.5

## 2013-09-18 MED ORDER — ROCURONIUM BROMIDE 50 MG/5ML IV SOLN
INTRAVENOUS | Status: AC
  Filled 2013-09-18: qty 1

## 2013-09-18 SURGICAL SUPPLY — 43 items
BLADE SAW SGTL 18X1.27X75 (BLADE) ×2 IMPLANT
BLADE SAW SGTL 18X1.27X75MM (BLADE) ×1
CAPT HIP PF COP ×3 IMPLANT
CLOSURE STERI-STRIP 1/2X4 (GAUZE/BANDAGES/DRESSINGS) ×2
CLSR STERI-STRIP ANTIMIC 1/2X4 (GAUZE/BANDAGES/DRESSINGS) ×4 IMPLANT
DECANTER SPIKE VIAL GLASS SM (MISCELLANEOUS) ×3 IMPLANT
DRAPE C-ARM 42X72 X-RAY (DRAPES) ×3 IMPLANT
DRAPE STERI IOBAN 125X83 (DRAPES) ×3 IMPLANT
DRAPE U-SHAPE 47X51 STRL (DRAPES) ×9 IMPLANT
DRSG ADAPTIC 3X8 NADH LF (GAUZE/BANDAGES/DRESSINGS) ×3 IMPLANT
DRSG MEPILEX BORDER 4X4 (GAUZE/BANDAGES/DRESSINGS) ×3 IMPLANT
DRSG MEPILEX BORDER 4X8 (GAUZE/BANDAGES/DRESSINGS) ×3 IMPLANT
DURAPREP 26ML APPLICATOR (WOUND CARE) ×3 IMPLANT
ELECT BLADE 6.5 EXT (BLADE) ×3 IMPLANT
ELECT REM PT RETURN 9FT ADLT (ELECTROSURGICAL) ×3
ELECTRODE REM PT RTRN 9FT ADLT (ELECTROSURGICAL) ×1 IMPLANT
EVACUATOR 1/8 PVC DRAIN (DRAIN) ×3 IMPLANT
FACESHIELD WRAPAROUND (MASK) ×12 IMPLANT
GLOVE BIO SURGEON STRL SZ8 (GLOVE) ×6 IMPLANT
GLOVE BIOGEL PI IND STRL 8 (GLOVE) ×1 IMPLANT
GLOVE BIOGEL PI INDICATOR 8 (GLOVE) ×2
GLOVE ECLIPSE 8.0 STRL XLNG CF (GLOVE) ×3 IMPLANT
GOWN STRL REUS W/ TWL LRG LVL3 (GOWN DISPOSABLE) ×1 IMPLANT
GOWN STRL REUS W/ TWL XL LVL3 (GOWN DISPOSABLE) ×1 IMPLANT
GOWN STRL REUS W/TWL LRG LVL3 (GOWN DISPOSABLE) ×2
GOWN STRL REUS W/TWL XL LVL3 (GOWN DISPOSABLE) ×2
KIT BASIN OR (CUSTOM PROCEDURE TRAY) ×3 IMPLANT
NDL SAFETY ECLIPSE 18X1.5 (NEEDLE) ×1 IMPLANT
NEEDLE 18GX1X1/2 (RX/OR ONLY) (NEEDLE) ×3 IMPLANT
NEEDLE 22X1 1/2 (OR ONLY) (NEEDLE) ×3 IMPLANT
NEEDLE HYPO 18GX1.5 SHARP (NEEDLE) ×2
PACK TOTAL JOINT (CUSTOM PROCEDURE TRAY) ×3 IMPLANT
SUT ETHIBOND NAB CT1 #1 30IN (SUTURE) ×9 IMPLANT
SUT MNCRL AB 4-0 PS2 18 (SUTURE) ×3 IMPLANT
SUT VIC AB 1 CT1 27 (SUTURE) ×2
SUT VIC AB 1 CT1 27XBRD ANTBC (SUTURE) ×1 IMPLANT
SUT VIC AB 2-0 CT1 27 (SUTURE) ×4
SUT VIC AB 2-0 CT1 TAPERPNT 27 (SUTURE) ×2 IMPLANT
SUT VLOC 180 0 24IN GS25 (SUTURE) ×3 IMPLANT
SYR 20CC LL (SYRINGE) ×3 IMPLANT
SYR 50ML LL SCALE MARK (SYRINGE) ×3 IMPLANT
TOWEL OR 17X26 10 PK STRL BLUE (TOWEL DISPOSABLE) ×6 IMPLANT
TRAY FOLEY CATH 16FR SILVER (SET/KITS/TRAYS/PACK) ×3 IMPLANT

## 2013-09-18 NOTE — Interval H&P Note (Signed)
History and Physical Interval Note:  09/18/2013 7:15 AM  Johnny Gonzalez  has presented today for surgery, with the diagnosis of left hip osteoarthritis  The various methods of treatment have been discussed with the patient and family. After consideration of risks, benefits and other options for treatment, the patient has consented to  Procedure(s): LEFT TOTAL HIP ARTHROPLASTY ANTERIOR APPROACH (Left) as a surgical intervention .  The patient's history has been reviewed, patient examined, no change in status, stable for surgery.  I have reviewed the patient's chart and labs.  Questions were answered to the patient's satisfaction.     Loanne DrillingALUISIO,FRANK V

## 2013-09-18 NOTE — Anesthesia Preprocedure Evaluation (Signed)
Anesthesia Evaluation  Patient identified by MRN, date of birth, ID band Patient awake    Reviewed: Allergy & Precautions, H&P , NPO status , Patient's Chart, lab work & pertinent test results  Airway Mallampati: II TM Distance: >3 FB Neck ROM: Full  Mouth opening: Limited Mouth Opening  Dental  (+) Teeth Intact   Pulmonary    Pulmonary exam normal       Cardiovascular hypertension, Pt. on medications     Neuro/Psych    GI/Hepatic hiatal hernia,   Endo/Other    Renal/GU      Musculoskeletal   Abdominal Normal abdominal exam  (+)   Peds  Hematology   Anesthesia Other Findings   Reproductive/Obstetrics                           Anesthesia Physical Anesthesia Plan  ASA: II  Anesthesia Plan: General   Post-op Pain Management:    Induction: Intravenous  Airway Management Planned: Oral ETT  Additional Equipment:   Intra-op Plan:   Post-operative Plan: Extubation in OR  Informed Consent: I have reviewed the patients History and Physical, chart, labs and discussed the procedure including the risks, benefits and alternatives for the proposed anesthesia with the patient or authorized representative who has indicated his/her understanding and acceptance.   Dental advisory given  Plan Discussed with: CRNA, Anesthesiologist and Surgeon  Anesthesia Plan Comments:         Anesthesia Quick Evaluation

## 2013-09-18 NOTE — Anesthesia Postprocedure Evaluation (Signed)
  Anesthesia Post-op Note  Patient: Johnny PrimesStephen T Gonzalez  Procedure(s) Performed: Procedure(s): LEFT TOTAL HIP ARTHROPLASTY ANTERIOR APPROACH (Left)  Patient Location: PACU  Anesthesia Type:General  Level of Consciousness: awake, alert , oriented and patient cooperative  Airway and Oxygen Therapy: Patient Spontanous Breathing  Post-op Pain: moderate  Post-op Assessment: Post-op Vital signs reviewed, Patient's Cardiovascular Status Stable, Respiratory Function Stable, Patent Airway, NAUSEA AND VOMITING PRESENT and Pain level controlled  Post-op Vital Signs: stable  Last Vitals:  Filed Vitals:   09/18/13 1100  BP: 149/83  Pulse: 76  Temp:   Resp: 20    Complications: No apparent anesthesia complications

## 2013-09-18 NOTE — H&P (View-Only) (Signed)
Johnny HahnStephen T. Gonzalez DOB: 24-Mar-1956 Married / Language: English / Race: White Male Date of Admission:  09-18-2013  Chief Complaint:  Left Hip Pain  History of Present Illness The patient is a 58 year old male who comes in for a preoperative History and Physical. The patient is scheduled for a left total hip arthroplasty (anterior approach) to be performed by Dr. Gus RankinFrank V. Aluisio[facility]. The patient reports left hip problems including pain symptoms that have been present for 2 year(s) (approximately). The symptoms began following a specific injury (does note history of 2 falls of horse). Symptoms reported include: hip pain (that is more activity related, especially notices with first steps after sitting) and popping, while reported symptoms do not include night pain Prior to being seen today the patient was previously evaluated by a primary physician (and by Dr. Cardell PeachGay with [facility]). Previous workup for this problem has included hip x-rays. This problem has not been previously treated. He has had problems with it for about 5 years when is was agrrevatied after falling off a horse. It became flared up again about a year ago. he describes activity realted pain and his wife states that he has been limping more. The amount of pain is sometimes related to the level of activity during the day. He will experience a lot of stiffness if he rides in a car for any length of time and has noticed that is has become more difficult with getting his shoes and socks on the left leg and foot. He denies night pain. There is no numbness but he has noticed some timgling in the left thigh down to the knee at times. He has taken Aleve and Tylenol which will give him some mild relief. He has stopped riding horses afraid that he might fall again and has stopped palying basketball. It has also started limiting his time at the gym. The pain is now bothering him at all times. The pain  is in the buttock, lower back, and does radiate to the groin. Occasionally it comes down the thigh, but generally stays proximal. He has had more functional limitations and limitations in motion. He is at a stage now where the hip is preventing him from doing things he desires. Even when he does things, it is getting more difficult for him to do so.  He is ready to get the hip fixed. They have been treated conservatively in the past for the above stated problem and despite conservative measures, they continue to have progressive pain and severe functional limitations and dysfunction. They have failed non-operative management including home exercise, medications, and injections. It is felt that they would benefit from undergoing total joint replacement. Risks and benefits of the procedure have been discussed with the patient and they elect to proceed with surgery. There are no active contraindications to surgery such as ongoing infection or rapidly progressive neurological disease.   Allergies No Known Drug Allergies   Problem List/Past Medical Hip osteoarthritis (715.95  M16.9) High blood pressure Shingles Hiatal Hernia   Family History Hypertension. First Degree Relatives. mother and father   Social History Tobacco use. Former smoker. former smoker; smoke(d) less than 1/2 pack(s) per day Pain Contract. no Marital status. married Number of flights of stairs before winded. 4-5 Alcohol use. current drinker; drinks beer; only occasionally per week Children. 1 Current work status. working full time [employer]. Living Will Illicit drug use. no Drug/Alcohol Rehab (Previously). no Tobacco / smoke exposure.  no Drug/Alcohol Rehab (Currently). no Living situation. live with spouse Exercise. Exercises monthly; does individual sport    Medication History Valsartan-Hydrochlorothiazide (160-25MG Tablet, Oral) Active.    Past Surgical  History Arthroscopy of Knee. left Arthroscopy of Shoulder. left Other Orthopaedic Surgery Left ACL Reconstruction. Date: 1999.   Review of Systems General:Not Present- Chills, Fever, Night Sweats, Fatigue, Weight Gain, Weight Loss and Memory Loss. Skin:Not Present- Hives, Itching, Rash, Eczema and Lesions. HEENT:Not Present- Tinnitus, Headache, Double Vision, Visual Loss, Hearing Loss and Dentures. Respiratory:Not Present- Shortness of breath with exertion, Shortness of breath at rest, Allergies, Coughing up blood and Chronic Cough. Cardiovascular:Not Present- Chest Pain, Racing/skipping heartbeats, Difficulty Breathing Lying Down, Murmur, Swelling and Palpitations. Gastrointestinal:Not Present- Bloody Stool, Heartburn, Abdominal Pain, Vomiting, Nausea, Constipation, Diarrhea, Difficulty Swallowing, Jaundice and Loss of appetitie. Male Genitourinary:Not Present- Urinary frequency, Blood in Urine, Weak urinary stream, Discharge, Flank Pain, Incontinence, Painful Urination, Urgency, Urinary Retention and Urinating at Night. Musculoskeletal:Present- Back Pain and Morning Stiffness. Not Present- Muscle Weakness, Muscle Pain, Joint Swelling, Joint Pain and Spasms. Neurological:Not Present- Tremor, Dizziness, Blackout spells, Paralysis, Difficulty with balance and Weakness. Psychiatric:Not Present- Insomnia.    Vitals Weight: 235 lb Height: 74 in Weight was reported by patient. Height was reported by patient. Body Surface Area: 2.36 m Body Mass Index: 30.17 kg/m BP: 114/72 (Sitting, Right Arm, Standard)   Physical Exam The physical exam findings are as follows:  Note: Patient is a 58 year old male with continued hip pain. Patient is accompanied today by his wife [name].   General Mental Status - Alert, cooperative and good historian. General Appearance- pleasant. Not in acute distress. Orientation- Oriented X3. Build & Nutrition- Well nourished and Well  developed.   Head and Neck Head- normocephalic, atraumatic . Neck Global Assessment- supple. no bruit auscultated on the right and no bruit auscultated on the left.   Eye Pupil- Bilateral- Regular and Round. Motion- Bilateral- EOMI.   Chest and Lung Exam Auscultation: Breath sounds:- clear at anterior chest wall and - clear at posterior chest wall. Adventitious sounds:- No Adventitious sounds.   Cardiovascular Auscultation:Rhythm- Regular rate and rhythm. Heart Sounds- S1 WNL and S2 WNL. Murmurs & Other Heart Sounds:Auscultation of the heart reveals - No Murmurs.   Abdomen Palpation/Percussion:Tenderness- Abdomen is non-tender to palpation. Rigidity (guarding)- Abdomen is soft. Auscultation:Auscultation of the abdomen reveals - Bowel sounds normal.   Male Genitourinary Not done, not pertinent to present illness  Musculoskeletal Well developed male, alert and oriented in no apparent distress. Evaluation of his right hip, normal range of motion. No discomfort. Left hip flexion is 90. Minimal internal rotation to about 20, external rotation 20, abduction all with pain. Knee exam is normal in both lower extremities. Pulses, sensation and motor are intact.  RADIOGRAPHS: AP pelvis, lateral of the left hip shows superolateral bone on bone arthritis with superolateral acetabular osteophytes and inferior femoral neck osteophytes.   Assessment & Plan Hip osteoarthritis (715.95  M16.9) Impression: Left Hip  Note: Plan is for a Left Total Hip Replacement by Dr. Aluisio.  Plan is to go home.  PCP - Dr. Pollock - Patient has been seen preoperatively and felt to be stable for surgery.  The patient does not have any contraindications and will receive TXA (tranexamic acid) prior to surgery.  Signed electronically by Alexzandrew L Perkins, III PA-C  

## 2013-09-18 NOTE — Progress Notes (Signed)
Utilization review completed.  

## 2013-09-18 NOTE — Transfer of Care (Signed)
Immediate Anesthesia Transfer of Care Note  Patient: Johnny Gonzalez  Procedure(s) Performed: Procedure(s): LEFT TOTAL HIP ARTHROPLASTY ANTERIOR APPROACH (Left)  Patient Location: PACU  Anesthesia Type:General  Level of Consciousness: awake, alert  and oriented  Airway & Oxygen Therapy: Patient Spontanous Breathing and Patient connected to face mask oxygen  Post-op Assessment: Report given to PACU RN  Post vital signs: Reviewed and stable  Complications: No apparent anesthesia complications

## 2013-09-18 NOTE — Plan of Care (Signed)
Problem: Consults Goal: Diagnosis- Total Joint Replacement Primary Total Hip Left     

## 2013-09-18 NOTE — Op Note (Signed)
OPERATIVE REPORT  PREOPERATIVE DIAGNOSIS: Osteoarthritis of the Left hip.   POSTOPERATIVE DIAGNOSIS: Osteoarthritis of the Left  hip.   PROCEDURE: Left total hip arthroplasty, anterior approach.   SURGEON: Ollen GrossFrank Aluisio, MD   ASSISTANT: Avel Peacerew Perkins, PA-C  ANESTHESIA:  General  ESTIMATED BLOOD LOSS:- 800 ml   DRAINS: Hemovac x1.   COMPLICATIONS: None   CONDITION: PACU - hemodynamically stable.   BRIEF CLINICAL NOTE: Johnny Gonzalez is a 58 y.o. male who has advanced end-  stage arthritis of his Left  hip with progressively worsening pain and  dysfunction.The patient has failed nonoperative management and presents for  total hip arthroplasty.   PROCEDURE IN DETAIL: After successful administration of spinal  anesthetic, the traction boots for the [facility] bed were placed on both  feet and the patient was [facility] onto the [facility] bed, boots placed into the leg  holders. The Left hip was then isolated from the perineum with plastic  drapes and prepped and draped in the usual sterile fashion. ASIS and  greater trochanter were marked and a oblique incision was made, starting  at about 1 cm lateral and 2 cm distal to the ASIS and coursing towards  the anterior cortex of the femur. The skin was cut with a 10 blade  through subcutaneous tissue to the level of the fascia overlying the  tensor fascia lata muscle. The fascia was then incised in line with the  incision at the junction of the anterior third and posterior 2/3rd. The  muscle was teased off the fascia and then the interval between the TFL  and the rectus was developed. The Hohmann retractor was then placed at  the top of the femoral neck over the capsule. The vessels overlying the  capsule were cauterized and the fat on top of the capsule was removed.  A Hohmann retractor was then placed anterior underneath the rectus  femoris to give exposure to the entire anterior capsule. A T-shaped  capsulotomy was performed. The  edges were tagged and the femoral head  was identified.       Osteophytes are removed off the superior acetabulum.  The femoral neck was then cut in situ with an oscillating saw. Traction  was then applied to the left lower extremity utilizing the [facility]  traction. The femoral head was then removed. Retractors were placed  around the acetabulum and then circumferential removal of the labrum was  performed. Osteophytes were also removed. Reaming starts at 47 mm to  medialize and  Increased in 2 mm increments to 55 mm. We reamed in  approximately 40 degrees of abduction, 20 degrees anteversion. A 56 mm  pinnacle acetabular shell was then impacted in anatomic position under  fluoroscopic guidance with excellent purchase. We did not need to place  any additional dome screws. A 36 mm neutral + 4 marathon liner was then  placed into the acetabular shell.       The femoral lift was then placed along the lateral aspect of the femur  just distal to the vastus ridge. The leg was  externally rotated and capsule  was stripped off the inferior aspect of the femoral neck down to the  level of the lesser trochanter, this was done with electrocautery. The femur was lifted after this was performed. The  leg was then placed and extended in adducted position to essentially delivering the femur. We also removed the capsule superiorly and the  piriformis from the  piriformis fossa to gain excellent exposure of the  proximal femur. Rongeur was used to remove some cancellous bone to get  into the lateral portion of the proximal femur for placement of the  initial starter reamer. The starter broaches was placed  the starter broach  and was shown to go down the center of the canal. Broaching  with the  Corail system was then performed starting at size 8, coursing  Up to size 12. A size 12 had excellent torsional and rotational  and axial stability. The trial standard offset neck was then placed  with a 36 + 1.5 trial  head. The hip was then reduced. We confirmed that  the stem was in the canal both on AP and lateral x-rays. It also has excellent sizing. The hip was reduced with outstanding stability through full extension, full external rotation,  and then flexion in adduction internal rotation. AP pelvis was taken  and the leg lengths were measured and found that the left leg was approximately 1 cm short. Hip  was then dislocated again and the femoral head and neck removed. The  femoral broach was removed. I then broached up to size 13 and left the stem approximately 7-8 mm above the neck cut and it had an excellent fit at this level. Size 13 Corail stem with a standard offset  neck was then impacted into the femur following native anteversion. Has  excellent purchase in the canal. Excellent torsional and rotational and  axial stability. It is confirmed to be in the canal on AP and lateral  fluoroscopic views. The 36 + 1.5 trial head was placed and the leg length was restored to equal. The hip is then dislocated and the permanent 36 + 1.5 ceramic head was placed and the hip  reduced with outstanding stability. Again AP pelvis was taken and it  confirmed that the leg lengths were equal. The wound was then copiously  irrigated with saline solution and the capsule reattached and repaired  with Ethibond suture.  20 mL of Exparel mixed with 50 mL of saline then additional 20 ml of .25% Bupivicaine injected into the capsule and into the edge of the tensor fascia lata as well as subcutaneous tissue. The fascia overlying the tensor fascia lata was  then closed with a running #1 V-Loc. Subcu was closed with interrupted  2-0 Vicryl and subcuticular running 4-0 Monocryl. Incision was cleaned  and dried. Steri-Strips and a bulky sterile dressing applied. Hemovac  drain was hooked to suction and then he was awakened and transported to  recovery in stable condition.        Please note that a surgical assistant was a medical  necessity for this procedure to perform it in a safe and expeditious manner. Assistant was necessary to provide appropriate retraction of vital neurovascular structures and to prevent femoral fracture and allow for anatomic placement of the prosthesis.  Ollen GrossFrank Aluisio, M.D.

## 2013-09-18 NOTE — Progress Notes (Signed)
Orthopedic Tech Progress Note Patient Details:  Johnny Gonzalez 10/22/1955 [id number]  Patient ID: Johnny Gonzalez, male   DOB: 10/22/1955, 58 y.o.   MRN: [id number] Pt stated that he is independent and does not need the trapeze bar patient helper; rn notified  Nikki DomCrawford, Rembert 09/18/2013, 8:45 PM

## 2013-09-18 NOTE — Evaluation (Signed)
Physical Therapy Evaluation Patient Details Name: Johnny PrimesStephen T Hopman MRN: [id number] DOB: September 03, 1955 Today's Date: 09/18/2013   History of Present Illness  Patient is a 58 y/o male admitted for left THA direct anterior approach.  Clinical Impression  Patient presents with decreased independence with mobility due to deficits listed in PT problem list.  He will benefit from skilled PT in the acute setting to allow return home with family assist and HHPT.    Follow Up Recommendations Home health PT;Supervision - Intermittent    Equipment Recommendations  Rolling walker with 5" wheels;3in1 (PT)    Recommendations for Other Services       Precautions / Restrictions Precautions Precautions: Fall Restrictions LLE Weight Bearing: Weight bearing as tolerated      Mobility  Bed Mobility Overal bed mobility: Needs Assistance Bed Mobility: Supine to Sit     Supine to sit: HOB elevated;Min assist     General bed mobility comments: for left LE  Transfers Overall transfer level: Needs assistance Equipment used: Rolling walker (2 wheeled) Transfers: Sit to/from Stand Sit to Stand: Min guard         General transfer comment: cues for safety and technique  Ambulation/Gait Ambulation/Gait assistance: Min guard Ambulation Distance (Feet): 400 Feet Assistive device: Rolling walker (2 wheeled) Gait Pattern/deviations: Step-through pattern Gait velocity: speedy   General Gait Details: cues for slower safer speed and for not pushing through pain  Stairs            Wheelchair Mobility    Modified Rankin (Stroke Patients Only)       Balance Overall balance assessment: No apparent balance deficits (not formally assessed)                                           Pertinent Vitals/Pain Initially denies pain    Home Living Family/patient expects to be discharged to:: Private residence Living Arrangements: Spouse/significant other Available Help at  Discharge: Family Type of Home: House Home Access: Stairs to enter Entrance Stairs-Rails: None Entrance Stairs-Number of Steps: two platform style steps Home Layout: One level Home Equipment: Crutches      Prior Function Level of Independence: Independent               Hand Dominance        Extremity/Trunk Assessment               Lower Extremity Assessment: LLE deficits/detail   LLE Deficits / Details: ankle AROM WFL, hip flexion NT, knee with positive quad set     Communication   Communication: No difficulties  Cognition Arousal/Alertness: Awake/alert Behavior During Therapy: WFL for tasks assessed/performed Overall Cognitive Status: Within Functional Limits for tasks assessed                      General Comments      Exercises Total Joint Exercises Ankle Circles/Pumps: AROM;10 reps;Both;Supine Quad Sets: AROM;Left;10 reps;Supine Heel Slides: AROM;5 reps;Supine;Both      Assessment/Plan    PT Assessment Patient needs continued PT services  PT Diagnosis Difficulty walking;Acute pain   PT Problem List Decreased strength;Pain;Decreased balance;Decreased mobility;Decreased knowledge of use of DME  PT Treatment Interventions Gait training;DME instruction;Therapeutic exercise;Stair training;Functional mobility training;Therapeutic activities;Patient/family education   PT Goals (Current goals can be found in the Care Plan section) Acute Rehab PT Goals Patient Stated Goal: To return to independent  PT Goal Formulation: With patient/family Time For Goal Achievement: 09/25/13 Potential to Achieve Goals: Good    Frequency Min 6X/week   Barriers to discharge        Co-evaluation               End of Session Equipment Utilized During Treatment: Gait belt Activity Tolerance: Patient tolerated treatment well Patient left: in chair;with call bell/phone within reach;with family/visitor present      Functional Assessment Tool Used:  Clinical Judgement Functional Limitation: Mobility: Walking and moving around Mobility: Walking and Moving Around Current Status ([id number]): At least 1 percent but less than 20 percent impaired, limited or restricted Mobility: Walking and Moving Around Goal Status [phone]([id number]): At least 1 percent but less than 20 percent impaired, limited or restricted    Time: [phone] PT Time Calculation (min): 24 min   Charges:   PT Evaluation $Initial PT Evaluation Tier I: 1 Procedure PT Treatments $Gait Training: 8-22 mins   PT G Codes:   Functional Assessment Tool Used: Clinical Judgement Functional Limitation: Mobility: Walking and moving around    [facility] Surgical CenterWYNN,CYNDI 09/18/2013, 4:52 PM  Monah[place]yndi Wynn, [place]PT [phone] 09/18/2013

## 2013-09-19 DIAGNOSIS — D62 Acute posthemorrhagic anemia: Secondary | ICD-10-CM | POA: Diagnosis not present

## 2013-09-19 LAB — BASIC METABOLIC PANEL
BUN: 14 mg/dL (ref 6–23)
CO2: 30 mEq/L (ref 19–32)
CREATININE: 0.97 mg/dL (ref 0.50–1.35)
Calcium: 8.3 mg/dL — ABNORMAL LOW (ref 8.4–10.5)
Chloride: 99 mEq/L (ref 96–112)
GFR calc Af Amer: >90 mL/min (ref >90)
GFR calc non Af Amer: 89 mL/min — ABNORMAL LOW (ref >90)
Glucose, Bld: 139 mg/dL — ABNORMAL HIGH (ref 70–99)
Potassium: 3.8 mEq/L (ref 3.7–5.3)
Sodium: 141 mEq/L (ref 137–147)

## 2013-09-19 LAB — CBC
HEMATOCRIT: 29.6 % — AB (ref 39.0–52.0)
Hemoglobin: 9.6 g/dL — ABNORMAL LOW (ref 13.0–17.0)
MCH: 28.2 pg (ref 26.0–34.0)
MCHC: 32.4 g/dL (ref 30.0–36.0)
MCV: 86.8 fL (ref 78.0–100.0)
Platelets: 182 10*3/uL (ref 150–400)
RBC: 3.41 MIL/uL — ABNORMAL LOW (ref 4.22–5.81)
RDW: 13.5 % (ref 11.5–15.5)
WBC: 10.3 10*3/uL (ref 4.0–10.5)

## 2013-09-19 NOTE — Evaluation (Signed)
Occupational Therapy Evaluation Patient Details Name: Johnny PrimesStephen T Gonzalez MRN: [id number] DOB: [birth year] Today's Date: 09/19/2013    History of Present Illness Patient is a 58 y/o male admitted for left THA direct anterior approach.   Clinical Impression   Pt s/p above. Pt moving well and education provided. No further OT needs.    Follow Up Recommendations  No OT follow up;Supervision - Intermittent    Equipment Recommendations  None recommended by OT    Recommendations for Other Services       Precautions / Restrictions Precautions Precautions: Fall Restrictions Weight Bearing Restrictions: Yes LLE Weight Bearing: Weight bearing as tolerated      Mobility Bed Mobility               General bed mobility comments: not assessed-pt in chair  Transfers Overall transfer level: Needs assistance Equipment used: Rolling walker (2 wheeled) Transfers: Sit to/from Stand Sit to Stand: Supervision         General transfer comment: pt moving fast    Balance                                            ADL Overall ADL's : Needs assistance/impaired                     Lower Body Dressing: Sit to/from stand;Supervision/safety   Toilet Transfer: Supervision/safety;Ambulation;RW;BSC       Tub/ Shower Transfer: Supervision/safety;Ambulation;3 in 1;Rolling walker   Functional mobility during ADLs: Supervision/safety;Rolling walker General ADL Comments: Educated on safety tips (shoes, rugs, pet in house, sitting for most of LB ADLs). Recommended wife be with him for shower transfer. Educated on technique and pt practiced.  Pt able to reach to don/doff left sock. Cues to slow down in session.     Vision                     Perception     Praxis      Pertinent Vitals/Pain Pain 0/10.      Hand Dominance     Extremity/Trunk Assessment Upper Extremity Assessment Upper Extremity Assessment: Overall WFL for tasks assessed    Lower Extremity Assessment Lower Extremity Assessment: Defer to PT evaluation       Communication Communication Communication: No difficulties   Cognition Arousal/Alertness: Awake/alert Behavior During Therapy: WFL for tasks assessed/performed;Impulsive Overall Cognitive Status: Within Functional Limits for tasks assessed                     General Comments       Exercises       Shoulder Instructions      Home Living Family/patient expects to be discharged to:: Private residence Living Arrangements: Spouse/significant other Available Help at Discharge: Family Type of Home: House Home Access: Stairs to enter Entergy CorporationEntrance Stairs-Number of Steps: two platform style steps Entrance Stairs-Rails: None Home Layout: One level     Bathroom Shower/Tub: Producer, television/film/videoWalk-in shower   Bathroom Toilet:  [facility])     Home Equipment: Crutches;Bedside commode          Prior Functioning/Environment Level of Independence: Independent             OT Diagnosis:     OT Problem List:     OT Treatment/Interventions:      OT Goals(Current goals can be found in the care plan section)  OT Frequency:     Barriers to D/C:            Co-evaluation              End of Session Equipment Utilized During Treatment: Rolling walker  Activity Tolerance: Patient tolerated treatment well Patient left: in chair;with family/visitor present   Time: 1046-1100 OT Time Calculation (min): 14 min Charges:  OT General Charges $OT Visit: 1 Procedure OT Evaluation $Initial OT Evaluation Tier I: 1 Procedure G-CodesEarlie Raveling:     Straub, Lindsey L OTR/L [id number] 09/19/2013, 1:12 PM

## 2013-09-19 NOTE — Progress Notes (Signed)
Johnny Gonzalez  MRN: [id number] DOB/Age: [birth year] 59 y.o. Physician: Lynnea MaizesK Rayla Pember, M.D. 1 Day Post-Op Procedure(s) (LRB): LEFT TOTAL HIP ARTHROPLASTY ANTERIOR APPROACH (Left)  Subjective: Resting comfortably sitting in chair at bedside, denies any pain. Vital Signs Temp:  [98.1 F (36.7 C)-98.4 F (36.9 C)] 98.4 F (36.9 C) (06/27 0547) Pulse Rate:  [68-85] 78 (06/27 0915) Resp:  [15-21] 18 (06/27 0915) BP: (107-159)/(54-90) 142/78 mmHg (06/27 0915) SpO2:  [90 %-99 %] 99 % (06/27 0915)  Lab Results  Recent Labs  09/19/13 0507  WBC 10.3  HGB 9.6*  HCT 29.6*  PLT 182   BMET  Recent Labs  09/19/13 0507  NA 141  K 3.8  CL 99  CO2 30  GLUCOSE 139*  BUN 14  CREATININE 0.97  CALCIUM 8.3*   INR  Date Value Ref Range Status  09/08/2013 0.99  0.00 - 1.49 Final     Exam  Hip incision dry, thigh soft, n/v intact distally  Plan D/c home today  Davy Faught M 09/19/2013, 10:04 AM

## 2013-09-19 NOTE — Discharge Instructions (Signed)
°Dr. Frank Aluisio °Total Joint Specialist °[facility] °[address] °[place], [place] [address] °[phone] ° ° ° °ANTERIOR APPROACH TOTAL HIP REPLACEMENT POSTOPERATIVE DIRECTIONS ° ° °Hip Rehabilitation, Guidelines Following Surgery  °The results of a hip operation are greatly improved after range of motion and muscle strengthening exercises. Follow all safety measures which are given to protect your hip. If any of these exercises cause increased pain or swelling in your joint, decrease the amount until you are comfortable again. Then slowly increase the exercises. Call your caregiver if you have problems or questions.  °HOME CARE INSTRUCTIONS  °Most of the following instructions are designed to prevent the dislocation of your new hip.  °Remove items at home which could result in a fall. This includes throw rugs or furniture in walking pathways.  °Continue medications as instructed at time of discharge. °· You may have some home medications which will be placed on hold until you complete the course of blood thinner medication. °· You may start showering once you are discharged home but do not submerge the incision under water. Just pat the incision dry and apply a dry gauze dressing on daily. °Do not put on socks or shoes without following the instructions of your caregivers.  °Sit on high chairs which makes it easier to stand.  °Sit on chairs with arms. Use the chair arms to help push yourself up when arising.  °Keep your leg on the side of the operation out in front of you when standing up.  °Arrange for the use of a toilet seat elevator so you are not sitting low.   °· Walk with walker as instructed.  °You may resume a sexual relationship in one month or when given the OK by your caregiver.  °Use walker as long as suggested by your caregivers.  °You may put full weight on your legs and walk as much as is comfortable. °Avoid periods of inactivity such as sitting longer than an hour  when not asleep. This helps prevent blood clots.  °You may return to work once you are cleared by your surgeon.  °Do not drive a car for 6 weeks or until released by your surgeon.  °Do not drive while taking narcotics.  °Wear elastic stockings for three weeks following surgery during the day but you may remove then at night.  °Make sure you keep all of your appointments after your operation with all of your doctors and caregivers. You should call the office at the above phone number and make an appointment for approximately two weeks after the date of your surgery. °Change the dressing daily and reapply a dry dressing each time. °Please pick up a stool softener and laxative for home use as long as you are requiring pain medications. °· Continue to use ice on the hip for pain and swelling from surgery. You may notice swelling that will progress down to the foot and ankle.  This is normal after  surgery.  Elevate the leg when you are not up walking on it.   °It is important for you to complete the blood thinner medication as prescribed by your doctor. °· Continue to use the breathing machine which will help keep your temperature down.  It is common for your temperature to cycle up and down following surgery, especially at night when you are not up moving around and exerting yourself.  The breathing machine keeps your lungs expanded and your temperature down. ° °RANGE OF MOTION AND STRENGTHENING EXERCISES  °  These exercises are designed to help you keep full movement of your hip joint. Follow your caregiver's or physical therapist's instructions. Perform all exercises about fifteen times, three times per day or as directed. Exercise both hips, even if you have had only one joint replacement. These exercises can be done on a training (exercise) mat, on the floor, on a table or on a bed. Use whatever works the best and is most comfortable for you. Use music or television while you are exercising so that the exercises are  a pleasant break in your day. This will make your life better with the exercises acting as a break in routine you can look forward to.  Lying on your back, slowly slide your foot toward your buttocks, raising your knee up off the floor. Then slowly slide your foot back down until your leg is straight again.  Lying on your back spread your legs as far apart as you can without causing discomfort.  Lying on your side, raise your upper leg and foot straight up from the floor as far as is comfortable. Slowly lower the leg and repeat.  Lying on your back, tighten up the muscle in the front of your thigh (quadriceps muscles). You can do this by keeping your leg straight and trying to raise your heel off the floor. This helps strengthen the largest muscle supporting your knee.  Lying on your back, tighten up the muscles of your buttocks both with the legs straight and with the knee bent at a comfortable angle while keeping your heel on the floor.   SKILLED REHAB INSTRUCTIONS: If the patient is transferred to a skilled rehab facility following release from the hospital, a list of the current medications will be sent to the facility for the patient to continue.  When discharged from the skilled rehab facility, please have the facility set up the patient's Home Health Physical Therapy prior to being released. Also, the skilled facility will be responsible for providing the patient with their medications at time of release from the facility to include their pain medication, the muscle relaxants, and their blood thinner medication. If the patient is still at the rehab facility at time of the two week follow up appointment, the skilled rehab facility will also need to assist the patient in arranging follow up appointment in our office and any transportation needs.  MAKE SURE YOU:  Understand these instructions.  Will watch your condition.  Will get help right away if you are not doing well or get worse.  Pick up  stool softner and laxative for home. Do not submerge incision under water. May shower. Continue to use ice for pain and swelling from surgery. Total Hip Protocol.  Pick up over the counter Iron Supplement and take twice a day for three weeks at home.  Take Xarelto for two and a half more weeks, then discontinue Xarelto. Once the patient has completed the blood thinner regimen, then take a Baby 81 mg Aspirin daily for three more weeks.

## 2013-09-19 NOTE — Care Management Note (Signed)
CARE MANAGEMENT NOTE 09/19/2013  Patient:  Johnny Gonzalez,Johnny Gonzalez   Account Number:  [id number]  Date Initiated:  09/19/2013  Documentation initiated by:  Vance PeperBRADY,Laela Deviney  Subjective/Objective Assessment:   58 yr old male s/p left total hip arthroplasty     Action/Plan:   Case manager spoke with patient and wife concerning home health and DME needs at discharge. Choice offered. Referral called to [facility] liason.   Anticipated DC Date:  09/19/2013   Anticipated DC Plan:  HOME W HOME HEALTH SERVICES      DC Planning Services  CM consult      PAC Choice  DURABLE MEDICAL EQUIPMENT  HOME HEALTH   Choice offered to / List presented to:  C-1 Patient   DME arranged  3-N-1  Blanche EastWALKER - [facility]ALL      DME agency  [facility]     HH arranged  [facility]   Status of service:  Completed, signed off Medicare Important Message given?   (If response is "NO", the following Medicare IM given date fields will be blank) Date Medicare IM given:   Date Additional Medicare IM given:    Discharge Disposition:  HOME W HOME HEALTH SERVICES  Per UR Regulation:  Reviewed for med. necessity/level of care/duration of stay

## 2013-09-19 NOTE — Progress Notes (Signed)
   Subjective: 1 Day Post-Op Procedure(s) (LRB): LEFT TOTAL HIP ARTHROPLASTY ANTERIOR APPROACH (Left) Patient reports no pain at this time.  He has already been up walking the hallway this morning.  Ready to go home. Patient seen in rounds with Dr. Lequita HaltAluisio. Patient is well, and has had no acute complaints or problems Patient is ready to go home today.  Objective: Vital signs in last 24 hours: Temp:  [98.1 F (36.7 C)-98.4 F (36.9 C)] 98.4 F (36.9 C) (06/27 0547) Pulse Rate:  [68-85] 81 (06/27 0547) Resp:  [15-21] 18 (06/27 0755) BP: (107-159)/(54-90) 113/54 mmHg (06/27 0547) SpO2:  [90 %-98 %] 98 % (06/27 0755)  Intake/Output from previous day:  Intake/Output Summary (Last 24 hours) at 09/19/13 0819 Last data filed at 09/19/13 0547  Gross per 24 hour  Intake   2990 ml  Output   5225 ml  Net  -2235 ml    Intake/Output this shift: UOP 2800 -2235  Labs:  Recent Labs  09/19/13 0507  HGB 9.6*    Recent Labs  09/19/13 0507  WBC 10.3  RBC 3.41*  HCT 29.6*  PLT 182    Recent Labs  09/19/13 0507  NA 141  K 3.8  CL 99  CO2 30  BUN 14  CREATININE 0.97  GLUCOSE 139*  CALCIUM 8.3*   No results found for this basename: LABPT, INR,  in the last 72 hours  EXAM: General - Patient is Alert, Appropriate and Oriented Extremity - Neurovascular intact Sensation intact distally Dorsiflexion/Plantar flexion intact Dressing - clean, dry, no drainage Motor Function - intact, moving foot and toes well on exam.  Hemovac drain is out.  Assessment/Plan: 1 Day Post-Op Procedure(s) (LRB): LEFT TOTAL HIP ARTHROPLASTY ANTERIOR APPROACH (Left) Procedure(s) (LRB): LEFT TOTAL HIP ARTHROPLASTY ANTERIOR APPROACH (Left) Past Medical History  Diagnosis Date  . Hypertension     takes Diovan daily  . Hyperlipidemia     borderline but has lost weight and eating better;takes Fish oil  . Weakness     numbness and tingling related to shoulder problems  . Arthritis   . Joint  pain   . Back pain   . H/O hiatal hernia   . History of shingles    Principal Problem:   OA (osteoarthritis) of hip Active Problems:   Postoperative anemia due to acute blood loss  Estimated body mass index is 30.8 kg/(m^2) as calculated from the following:   Height as of this encounter: 6\' 2"  (1.88 m).   Weight as of this encounter: 108.863 kg (240 lb). Up with therapy Discharge home with home health Diet - Cardiac diet Follow up - in 2 weeks Activity - WBAT Disposition - Home Condition Upon Discharge - Good D/C Meds - See DC Summary DVT Prophylaxis - Xarelto  Avel Peacerew Perkins, PA-C Orthopaedic Surgery 09/19/2013, 8:19 AM

## 2013-09-19 NOTE — Discharge Summary (Signed)
Physician Discharge Summary   Patient ID: JOHNSTON Gonzalez MRN: [id number] DOB/AGE: 10/11/1955 58 y.o.  Admit date: 09/18/2013 Discharge date: 09-19-2013  Primary Diagnosis:  Osteoarthritis of the Left hip.   Admission Diagnoses:  Past Medical History  Diagnosis Date  . Hypertension     takes Diovan daily  . Hyperlipidemia     borderline but has lost weight and eating better;takes Fish oil  . Weakness     numbness and tingling related to shoulder problems  . Arthritis   . Joint pain   . Back pain   . H/O hiatal hernia   . History of shingles    Discharge Diagnoses:   Principal Problem:   OA (osteoarthritis) of hip Active Problems:   Postoperative anemia due to acute blood loss  Estimated body mass index is 30.8 kg/(m^2) as calculated from the following:   Height as of this encounter: 6' 2"  (1.88 m).   Weight as of this encounter: 108.863 kg (240 lb).  Procedure(s) (LRB): LEFT TOTAL HIP ARTHROPLASTY ANTERIOR APPROACH (Left)   Consults: None  HPI: Johnny Gonzalez is a 58 y.o. male who has advanced end-  stage arthritis of his Left hip with progressively worsening pain and  dysfunction.The patient has failed nonoperative management and presents for  total hip arthroplasty.   Laboratory Data: Admission on 09/18/2013  Component Date Value Ref Range Status  . WBC 09/19/2013 10.3  4.0 - 10.5 K/uL Final  . RBC 09/19/2013 3.41* 4.22 - 5.81 MIL/uL Final  . Hemoglobin 09/19/2013 9.6* 13.0 - 17.0 g/dL Final  . HCT 09/19/2013 29.6* 39.0 - 52.0 % Final  . MCV 09/19/2013 86.8  78.0 - 100.0 fL Final  . MCH 09/19/2013 28.2  26.0 - 34.0 pg Final  . MCHC 09/19/2013 32.4  30.0 - 36.0 g/dL Final  . RDW 09/19/2013 13.5  11.5 - 15.5 % Final  . Platelets 09/19/2013 182  150 - 400 K/uL Final  . Sodium 09/19/2013 141  137 - 147 mEq/L Final  . Potassium 09/19/2013 3.8  3.7 - 5.3 mEq/L Final  . Chloride 09/19/2013 99  96 - 112 mEq/L Final  . CO2 09/19/2013 30  19 - 32 mEq/L Final  .  Glucose, Bld 09/19/2013 139* 70 - 99 mg/dL Final  . BUN 09/19/2013 14  6 - 23 mg/dL Final  . Creatinine, Ser 09/19/2013 0.97  0.50 - 1.35 mg/dL Final  . Calcium 09/19/2013 8.3* 8.4 - 10.5 mg/dL Final  . GFR calc non Af Amer 09/19/2013 89* >90 mL/min Final  . GFR calc Af Amer 09/19/2013 >90  >90 mL/min Final   Comment: (NOTE)                          The eGFR has been calculated using the CKD EPI equation.                          This calculation has not been validated in all clinical situations.                          eGFR's persistently <90 mL/min signify possible Chronic Kidney                          Disease.  Hospital Outpatient Visit on 09/08/2013  Component Date Value Ref Range Status  . MRSA, PCR 09/08/2013 NEGATIVE  NEGATIVE Final  . Staphylococcus aureus 09/08/2013 NEGATIVE  NEGATIVE Final   Comment:                                 The Xpert SA Assay (FDA                          approved for NASAL specimens                          in patients over 28 years of age),                          is one component of                          a comprehensive surveillance                          program.  Test performance has                          been validated by [employer] for patients greater                          than or equal to 59 year old.                          It is not intended                          to diagnose infection nor to                          guide or monitor treatment.  Marland Kitchen aPTT 09/08/2013 35  24 - 37 seconds Final  . WBC 09/08/2013 7.1  4.0 - 10.5 K/uL Final  . RBC 09/08/2013 4.49  4.22 - 5.81 MIL/uL Final  . Hemoglobin 09/08/2013 12.8* 13.0 - 17.0 g/dL Final  . HCT 09/08/2013 39.1  39.0 - 52.0 % Final  . MCV 09/08/2013 87.1  78.0 - 100.0 fL Final  . MCH 09/08/2013 28.5  26.0 - 34.0 pg Final  . MCHC 09/08/2013 32.7  30.0 - 36.0 g/dL Final  . RDW 09/08/2013 13.6  11.5 - 15.5 % Final  . Platelets 09/08/2013 207  150 -  400 K/uL Final  . Sodium 09/08/2013 142  137 - 147 mEq/L Final  . Potassium 09/08/2013 3.7  3.7 - 5.3 mEq/L Final  . Chloride 09/08/2013 99  96 - 112 mEq/L Final  . CO2 09/08/2013 32  19 - 32 mEq/L Final  . Glucose, Bld 09/08/2013 93  70 - 99 mg/dL Final  . BUN 09/08/2013 18  6 - 23 mg/dL Final  . Creatinine, Ser 09/08/2013 1.26  0.50 - 1.35 mg/dL Final  . Calcium 09/08/2013 9.1  8.4 - 10.5 mg/dL Final  . Total Protein 09/08/2013 7.3  6.0 - 8.3 g/dL Final  . Albumin 09/08/2013 4.1  3.5 - 5.2 g/dL Final  .  AST 09/08/2013 22  0 - 37 U/L Final  . ALT 09/08/2013 26  0 - 53 U/L Final  . Alkaline Phosphatase 09/08/2013 78  39 - 117 U/L Final  . Total Bilirubin 09/08/2013 0.5  0.3 - 1.2 mg/dL Final  . GFR calc non Af Amer 09/08/2013 61* >90 mL/min Final  . GFR calc Af Amer 09/08/2013 70* >90 mL/min Final   Comment: (NOTE)                          The eGFR has been calculated using the CKD EPI equation.                          This calculation has not been validated in all clinical situations.                          eGFR's persistently <90 mL/min signify possible Chronic Kidney                          Disease.  Marland Kitchen Prothrombin Time 09/08/2013 12.9  11.6 - 15.2 seconds Final  . INR 09/08/2013 0.99  0.00 - 1.49 Final  . ABO/RH(D) 09/08/2013 O POS   Final  . Antibody Screen 09/08/2013 NEG   Final  . Sample Expiration 09/08/2013 09/22/2013   Final  . Color, Urine 09/08/2013 YELLOW  YELLOW Final  . APPearance 09/08/2013 CLOUDY* CLEAR Final  . Specific Gravity, Urine 09/08/2013 1.027  1.005 - 1.030 Final  . pH 09/08/2013 6.0  5.0 - 8.0 Final  . Glucose, UA 09/08/2013 NEGATIVE  NEGATIVE mg/dL Final  . Hgb urine dipstick 09/08/2013 NEGATIVE  NEGATIVE Final  . Bilirubin Urine 09/08/2013 NEGATIVE  NEGATIVE Final  . Ketones, ur 09/08/2013 15* NEGATIVE mg/dL Final  . Protein, ur 09/08/2013 NEGATIVE  NEGATIVE mg/dL Final  . Urobilinogen, UA 09/08/2013 0.2  0.0 - 1.0 mg/dL Final  . Nitrite  09/08/2013 NEGATIVE  NEGATIVE Final  . Leukocytes, UA 09/08/2013 NEGATIVE  NEGATIVE Final   MICROSCOPIC NOT DONE ON URINES WITH NEGATIVE PROTEIN, BLOOD, LEUKOCYTES, NITRITE, OR GLUCOSE <1000 mg/dL.  . ABO/RH(D) 09/08/2013 O POS   Final     X-Rays:Dg Chest 2 View  09/18/2013   CLINICAL DATA:  Preoperative respiratory exam for hip surgery.  EXAM: CHEST  2 VIEW  COMPARISON:  None.  FINDINGS: Heart size is normal. Mediastinal shadows are normal. The lungs are clear. No effusions. No bony abnormalities.  IMPRESSION: Normal chest   Electronically Signed   By: Nelson Chimes M.D.   On: 09/18/2013 07:44   Dg Hip Complete Left  09/08/2013   CLINICAL DATA:  Left total hip arthroplasty.  Preop.  EXAM: LEFT HIP - COMPLETE 2+ VIEW  COMPARISON:  None.  FINDINGS: Moderate degenerative changes within the left hip with joint space narrowing, early spurring and subchondral sclerosis. SI joints and right hip joint are unremarkable. No acute bony abnormality. Specifically, no fracture, subluxation, or dislocation. Soft tissues are intact.  IMPRESSION: Moderate osteoarthritic changes within the left hip.   Electronically Signed   By: Rolm Baptise M.D.   On: 09/08/2013 14:35   Dg Hip Operative Left  09/18/2013   CLINICAL DATA:  Left hip arthroplasty  EXAM: OPERATIVE LEFT HIP  FLUOROSCOPY TIME:  16 seconds  COMPARISON:  None  FINDINGS: Left total hip arthroplasty without failure or complication. No dislocation.  IMPRESSION: Left total hip arthroplasty.   Electronically Signed   By: Kathreen Devoid   On: 09/18/2013 10:38   Dg Pelvis Portable  09/18/2013   CLINICAL DATA:  Postop left hip  EXAM: PORTABLE PELVIS 1-2 VIEWS  COMPARISON:  None.  FINDINGS: The left hip demonstrates a total left hip arthroplasty without evidence of hardware failure complication. There is no fracture or dislocation. The alignment is anatomic. Surgical drain is present. Post-surgical changes noted in the surrounding soft tissues.  IMPRESSION: Total left  hip arthroplasty.   Electronically Signed   By: Kathreen Devoid   On: 09/18/2013 11:34    EKG: Orders placed in visit on 09/08/13  . EKG 12-LEAD     Hospital Course: Patient was admitted to [facility] and taken to the OR and underwent the above state procedure without complications.  Patient tolerated the procedure well and was later transferred to the recovery room and then to the orthopaedic floor for postoperative care.  They were given PO and IV analgesics for pain control following their surgery.  They were given 24 hours of postoperative antibiotics of  Anti-infectives   Start     Dose/Rate Route Frequency Ordered Stop   09/18/13 2100  vancomycin (VANCOCIN) IVPB 1000 mg/200 mL premix     1,000 mg 200 mL/hr over 60 Minutes Intravenous Every 12 hours 09/18/13 1220 09/18/13 2330   09/18/13 0800  vancomycin (VANCOCIN) 1,500 mg in sodium chloride 0.9 % 500 mL IVPB     1,500 mg 250 mL/hr over 120 Minutes Intravenous  Once 09/18/13 0752 09/18/13 0857   09/18/13 0600  ceFAZolin (ANCEF) IVPB 2 g/50 mL premix  Status:  Discontinued     2 g 100 mL/hr over 30 Minutes Intravenous On call to O.R. 09/17/13 1422 09/18/13 0735     and started on DVT prophylaxis in the form of Xarelto.   PT and OT were ordered for total hip protocol.  The patient was allowed to be WBAT with therapy. Discharge planning was consulted to help with postop disposition and equipment needs.  Patient had a very good night on the evening of surgery and got up and walked over 400 feet.  They started to get up OOB with therapy again on day one.  Hemovac drain was pulled without difficulty.  Patient was seen in rounds and was ready to go home the morning of POD 1.  Discharge home with home health  Diet - Cardiac diet  Follow up - in 2 weeks  Activity - WBAT  Disposition - Home  Condition Upon Discharge - Good  D/C Meds - See DC Summary  DVT Prophylaxis - Xarelto   Discharge Instructions   Call MD / Call 911     Complete by:  As directed   If you experience chest pain or shortness of breath, CALL 911 and be transported to the hospital emergency room.  If you develope a fever above 101 F, pus (white drainage) or increased drainage or redness at the wound, or calf pain, call your surgeon's office.     Change dressing    Complete by:  As directed   You may change your dressing dressing daily with sterile 4 x 4 inch gauze dressing and paper tape.  Do not submerge the incision under water.     Constipation Prevention    Complete by:  As directed   Drink plenty of fluids.  Prune juice may be helpful.  You may use a stool softener,  such as Colace (over the counter) 100 mg twice a day.  Use MiraLax (over the counter) for constipation as needed.     Diet - low sodium heart healthy    Complete by:  As directed      Discharge instructions    Complete by:  As directed   Pick up stool softner and laxative for home. Do not submerge incision under water. May shower. Continue to use ice for pain and swelling from surgery.  Total Hip Protocol.  Pick up over the counter Iron Supplement and take twice a day for three weeks.  Take Xarelto for two and a half more weeks, then discontinue Xarelto. Once the patient has completed the blood thinner regimen, then take a Baby 81 mg Aspirin daily for three more weeks.     Do not sit on low chairs, stoools or toilet seats, as it may be difficult to get up from low surfaces    Complete by:  As directed      Driving restrictions    Complete by:  As directed   No driving until released by the physician.     Increase activity slowly as tolerated    Complete by:  As directed      Lifting restrictions    Complete by:  As directed   No lifting until released by the physician.     Patient may shower    Complete by:  As directed   You may shower without a dressing once there is no drainage.  Do not wash over the wound.  If drainage remains, do not shower until drainage stops.       TED hose    Complete by:  As directed   Use stockings (TED hose) for 3 weeks on both leg(s).  You may remove them at night for sleeping.     Weight bearing as tolerated    Complete by:  As directed             Medication List    STOP taking these medications       ALEVE PO     CALCIUM PO     GREEN TEA PO     POMEGRANATE PO     VITAMIN C PO      TAKE these medications       methocarbamol 500 MG tablet  Commonly known as:  ROBAXIN  Take 1 tablet (500 mg total) by mouth every 6 (six) hours as needed for muscle spasms.     oxyCODONE 5 MG immediate release tablet  Commonly known as:  Oxy IR/ROXICODONE  Take 1-2 tablets (5-10 mg total) by mouth every 3 (three) hours as needed for breakthrough pain.     rivaroxaban 10 MG Tabs tablet  Commonly known as:  XARELTO  Take 1 tablet (10 mg total) by mouth daily with breakfast.     traMADol 50 MG tablet  Commonly known as:  ULTRAM  Take 1-2 tablets (50-100 mg total) by mouth every 6 (six) hours as needed for moderate pain.     valsartan-hydrochlorothiazide 160-25 MG per tablet  Commonly known as:  DIOVAN-HCT  Take 1 tablet by mouth daily with breakfast.           Follow-up Information   Follow up with Gearlean Alf, MD. Schedule an appointment as soon as possible for a visit on 10/01/2013. (Call [phone] Monday to make the appointment)    Specialty:  Orthopedic Surgery   Contact information:   [address] [place] [address] [phone]       Signed: Arlee Muslim, PA-C Orthopaedic Surgery 09/19/2013, 8:25 AM

## 2013-09-19 NOTE — Progress Notes (Signed)
Physical Therapy Treatment Patient Details Name: Lauralyn PrimesStephen T Gonzalez MRN: [id number] DOB: 12-08-1955 Today's Date: 09/19/2013    History of Present Illness Patient is a 58 y/o male admitted for left THA direct anterior approach.    PT Comments    Pt moving well, cued to slow down for safety as he is impulsive with transitional movements and gait. Pt with question about when he could resume sexual relations with spouse, advised to follow up with MD and once again proceed cautiously as he just had total hip replacement.   Follow Up Recommendations  Home health PT;Supervision - Intermittent     Equipment Recommendations  Rolling walker with 5" wheels;3in1 (PT)       Precautions / Restrictions Precautions Precautions: Fall Restrictions LLE Weight Bearing: Weight bearing as tolerated    Mobility  Bed Mobility               General bed mobility comments: not assessed, in recliner before and after session  Transfers Overall transfer level: Needs assistance Equipment used: Rolling walker (2 wheeled) Transfers: Sit to/from Stand Sit to Stand: Supervision         General transfer comment: cues for safety (slow down) and for hand placement   Ambulation/Gait Ambulation/Gait assistance: Supervision Ambulation Distance (Feet): 400 Feet Assistive device: Rolling walker (2 wheeled) Gait Pattern/deviations: Step-through pattern Gait velocity: increased Gait velocity interpretation: at or above normal speed for age/gender General Gait Details: cues to slow down for safety with gait, heavy UE reliance on walker with gait   Stairs Stairs: Yes Stairs assistance: Supervision Stair Management: No rails;With walker;Step to pattern Number of Stairs: 1 General stair comments: cues on sequence and technique with stair  Wheelchair Mobility    Modified Rankin (Stroke Patients Only)          Cognition Arousal/Alertness: Awake/alert Behavior During Therapy: WFL for tasks  assessed/performed;Impulsive Overall Cognitive Status: Within Functional Limits for tasks assessed                          PT Goals (current goals can now be found in the care plan section) Acute Rehab PT Goals Patient Stated Goal: To return to independent PT Goal Formulation: With patient/family Time For Goal Achievement: 09/25/13 Potential to Achieve Goals: Good Progress towards PT goals: Progressing toward goals    Frequency  Min 6X/week    PT Plan Current plan remains appropriate       End of Session Equipment Utilized During Treatment: Gait belt Activity Tolerance: Patient tolerated treatment well Patient left: in chair;with call bell/phone within reach;with family/visitor present     Time: 0915-0930 PT Time Calculation (min): 15 min  Charges:  $Gait Training: 8-22 mins                    G Codes:      Johnny Gonzalez, Johnny Gonzalez 09/19/2013, 1:04 PM  Johnny KusterKathy Adalynne Gonzalez, PTA Office- [phone]

## 2013-09-22 ENCOUNTER — Encounter (HOSPITAL_COMMUNITY): Payer: Self-pay | Admitting: Orthopedic Surgery

## 2013-09-28 NOTE — Addendum Note (Signed)
Addendum created 09/28/13 0818 by Laverle HobbyGregory Elma Limas, MD   Modules edited: Anesthesia Responsible Staff

## 2013-10-30 ENCOUNTER — Emergency Department (HOSPITAL_COMMUNITY)
Admission: EM | Admit: 2013-10-30 | Discharge: 2013-10-30 | Disposition: A | Discharge status: Home / Self Care | Payer: No Typology Code available for payment source | Source: Home / Self Care | Attending: Family Medicine | Admitting: Family Medicine

## 2013-10-30 ENCOUNTER — Encounter (HOSPITAL_COMMUNITY): Payer: Self-pay | Admitting: Emergency Medicine

## 2013-10-30 DIAGNOSIS — M19019 Primary osteoarthritis, unspecified shoulder: Secondary | ICD-10-CM

## 2013-10-30 DIAGNOSIS — M19011 Primary osteoarthritis, right shoulder: Secondary | ICD-10-CM

## 2013-10-30 DIAGNOSIS — M19012 Primary osteoarthritis, left shoulder: Principal | ICD-10-CM

## 2013-10-30 MED ORDER — TRIAMCINOLONE ACETONIDE 40 MG/ML IJ SUSP
INTRAMUSCULAR | Status: AC
  Filled 2013-10-30: qty 1

## 2013-10-30 NOTE — ED Provider Notes (Signed)
CSN: [id number]     Arrival date & time 10/30/13  0807 History   First MD Initiated Contact with Patient 10/30/13 [phone]     Chief Complaint  Patient presents with  . Shoulder Pain   (Consider location/radiation/quality/duration/timing/severity/associated sxs/prior Treatment) Patient is a 58 y.o. male presenting with shoulder pain. The history is provided by the patient.  Shoulder Pain This is a chronic problem. The current episode started more than 1 week ago. The problem has been gradually worsening. Pertinent negatives include no chest pain and no abdominal pain. Associated symptoms comments: Crepitation and restricted rom of shoulders, distal nvt intact.    Past Medical History  Diagnosis Date  . Hypertension     takes Diovan daily  . Hyperlipidemia     borderline but has lost weight and eating better;takes Fish oil  . Weakness     numbness and tingling related to shoulder problems  . Arthritis   . Joint pain   . Back pain   . H/O hiatal hernia   . History of shingles    Past Surgical History  Procedure Laterality Date  . Shoulder surgery Left   . Arthroscopic repair acl Left   . Elbow surgery Right   . Tonsillectomy    . Adenoidectomy    . Colonoscopy    . Total hip arthroplasty Left 09/18/2013    Procedure: LEFT TOTAL HIP ARTHROPLASTY ANTERIOR APPROACH;  Surgeon: Loanne DrillingFrank Aluisio V, MD;  Location: [facility] OR;  Service: Orthopedics;  Laterality: Left;   No family history on file. History  Substance Use Topics  . Smoking status: Never Smoker   . Smokeless tobacco: Not on file  . Alcohol Use: Yes     Comment: weekly in the summer    Review of Systems  Cardiovascular: Negative for chest pain.  Gastrointestinal: Negative for abdominal pain.  Musculoskeletal: Positive for joint swelling.  Skin: Negative.     Allergies  Penicillins  Home Medications   Prior to Admission medications   Medication Sig Start Date End Date Taking? Authorizing Provider  methocarbamol  (ROBAXIN) 500 MG tablet Take 1 tablet (500 mg total) by mouth every 6 (six) hours as needed for muscle spasms. 09/18/13   Loanne DrillingFrank Aluisio V, MD  oxyCODONE (OXY IR/ROXICODONE) 5 MG immediate release tablet Take 1-2 tablets (5-10 mg total) by mouth every 3 (three) hours as needed for breakthrough pain. 09/18/13   Loanne DrillingFrank Aluisio V, MD  rivaroxaban (XARELTO) 10 MG TABS tablet Take 1 tablet (10 mg total) by mouth daily with breakfast. 09/19/13   Loanne DrillingFrank Aluisio V, MD  traMADol (ULTRAM) 50 MG tablet Take 1-2 tablets (50-100 mg total) by mouth every 6 (six) hours as needed for moderate pain. 09/18/13   Loanne DrillingFrank Aluisio V, MD  valsartan-hydrochlorothiazide (DIOVAN-HCT) 160-25 MG per tablet Take 1 tablet by mouth daily with breakfast.    Historical Provider, MD   BP 115/77  Pulse 86  Temp(Src) 97.9 F (36.6 C) (Oral)  SpO2 98% Physical Exam  Nursing note and vitals reviewed. Constitutional: He is oriented to person, place, and time. He appears well-developed and well-nourished.  Musculoskeletal: He exhibits tenderness.       Right shoulder: He exhibits decreased range of motion, tenderness, bony tenderness, crepitus and pain. He exhibits normal pulse and normal strength.       Left shoulder: He exhibits decreased range of motion, tenderness, bony tenderness, crepitus and pain. He exhibits no spasm, normal pulse and normal strength.  Neurological: He is alert and oriented to  person, place, and time.  Skin: Skin is warm and dry.    ED Course  ARTHOCENTESIS Date/Time: 10/30/2013 8:45 AM Performed by: Linna Hoff Authorized by: Bradd Canary D Consent: Verbal consent obtained. Risks and benefits: risks, benefits and alternatives were discussed Consent given by: patient Indications: pain  Body area: shoulder Joint: right shoulder Local anesthesia used: no Patient sedated: no Preparation: Patient was prepped and draped in the usual sterile fashion. Needle gauge: 22 G Ultrasound guidance: no Approach:  lateral Triamcinolone amount: 40 mg Bupivacaine 0.50% amount: 5 ml Patient tolerance: Patient tolerated the procedure well with no immediate complications.   (including critical care time) Labs Review Labs Reviewed - No data to display  Imaging Review No results found.   MDM   1. Primary osteoarthritis of both shoulders        Linna Hoff, MD 10/30/13 [phone]

## 2013-10-30 NOTE — Discharge Instructions (Signed)
Ice as needed, see your orthopedist as planned

## 2013-10-30 NOTE — ED Notes (Signed)
Patient c/o bilateral shoulder pain. This is a chronic problem. Patient reports the right is worst than the left. Patient reports hx of broken collar bone. Patient is alert and oriented and in no acute distress.

## 2015-08-16 IMAGING — CR DG HIP (WITH OR WITHOUT PELVIS) 2-3V*L*
3 series · 3 of 3 positions shown · non-contrast
Comparison: None.

CLINICAL DATA: Left total hip arthroplasty.  Preop.

EXAM:
LEFT HIP - COMPLETE 2+ VIEW

[t pelvis ap]
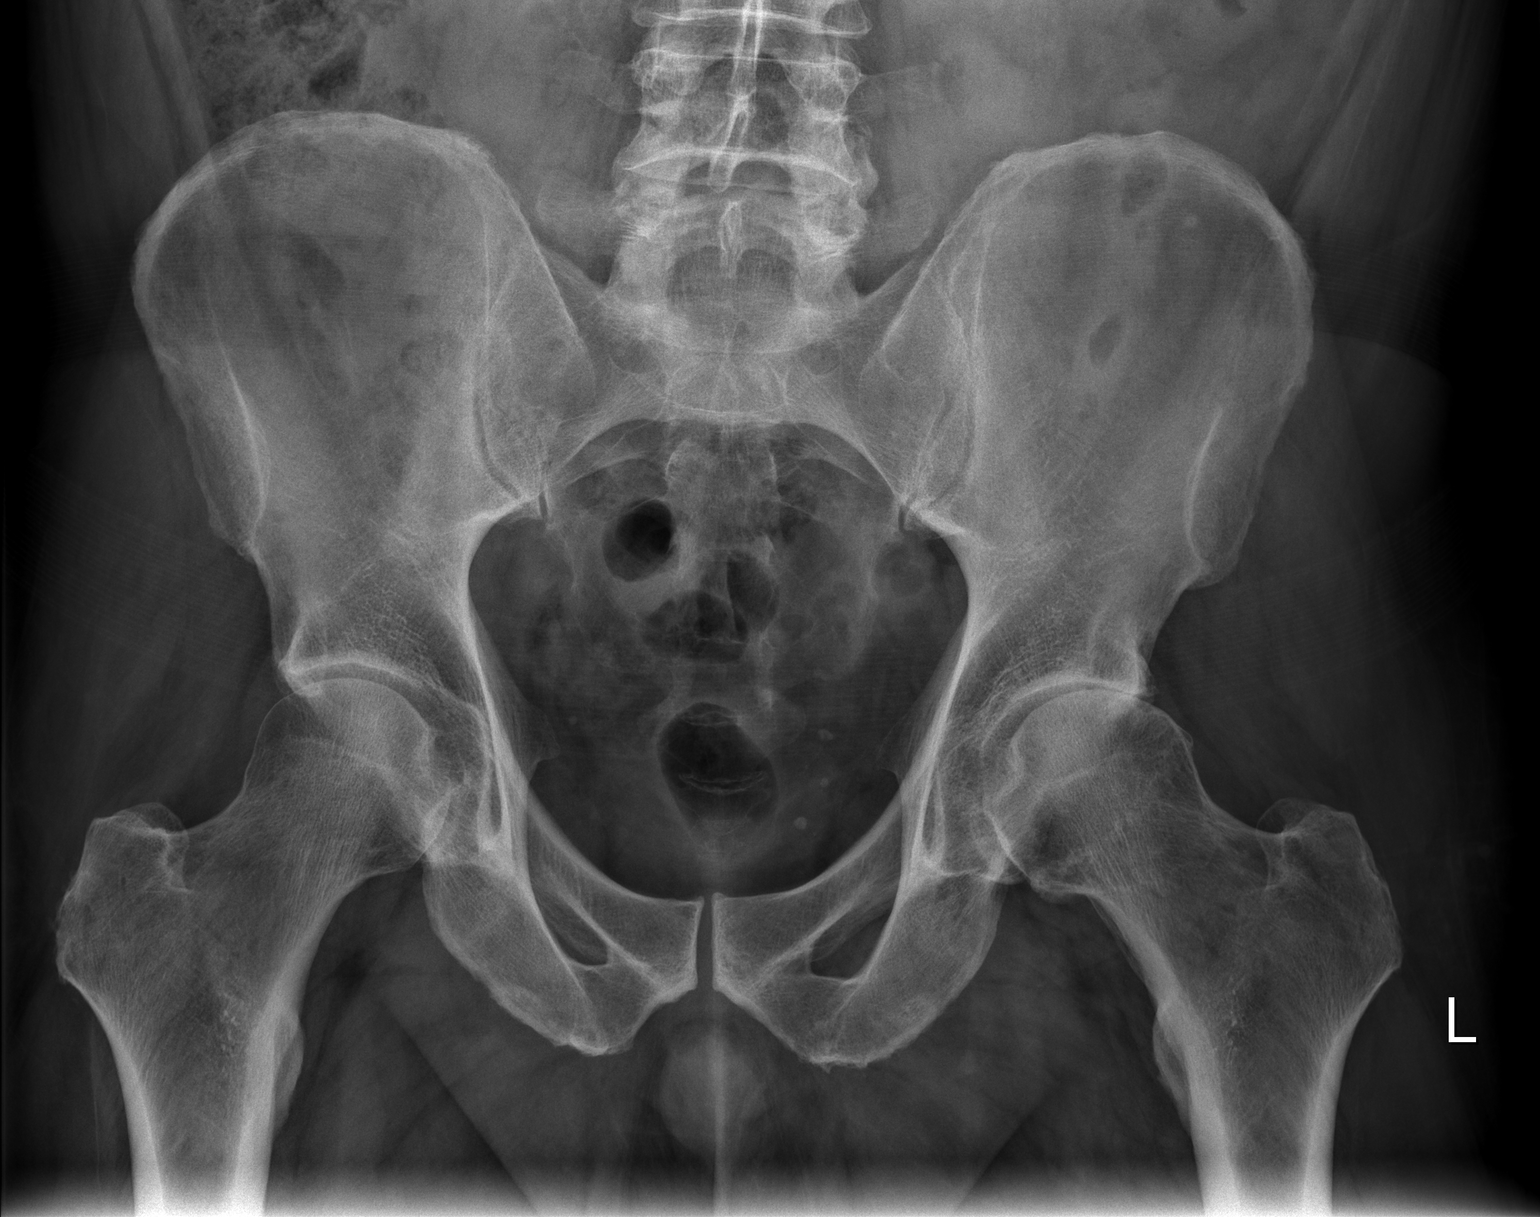

[t hip ap left]
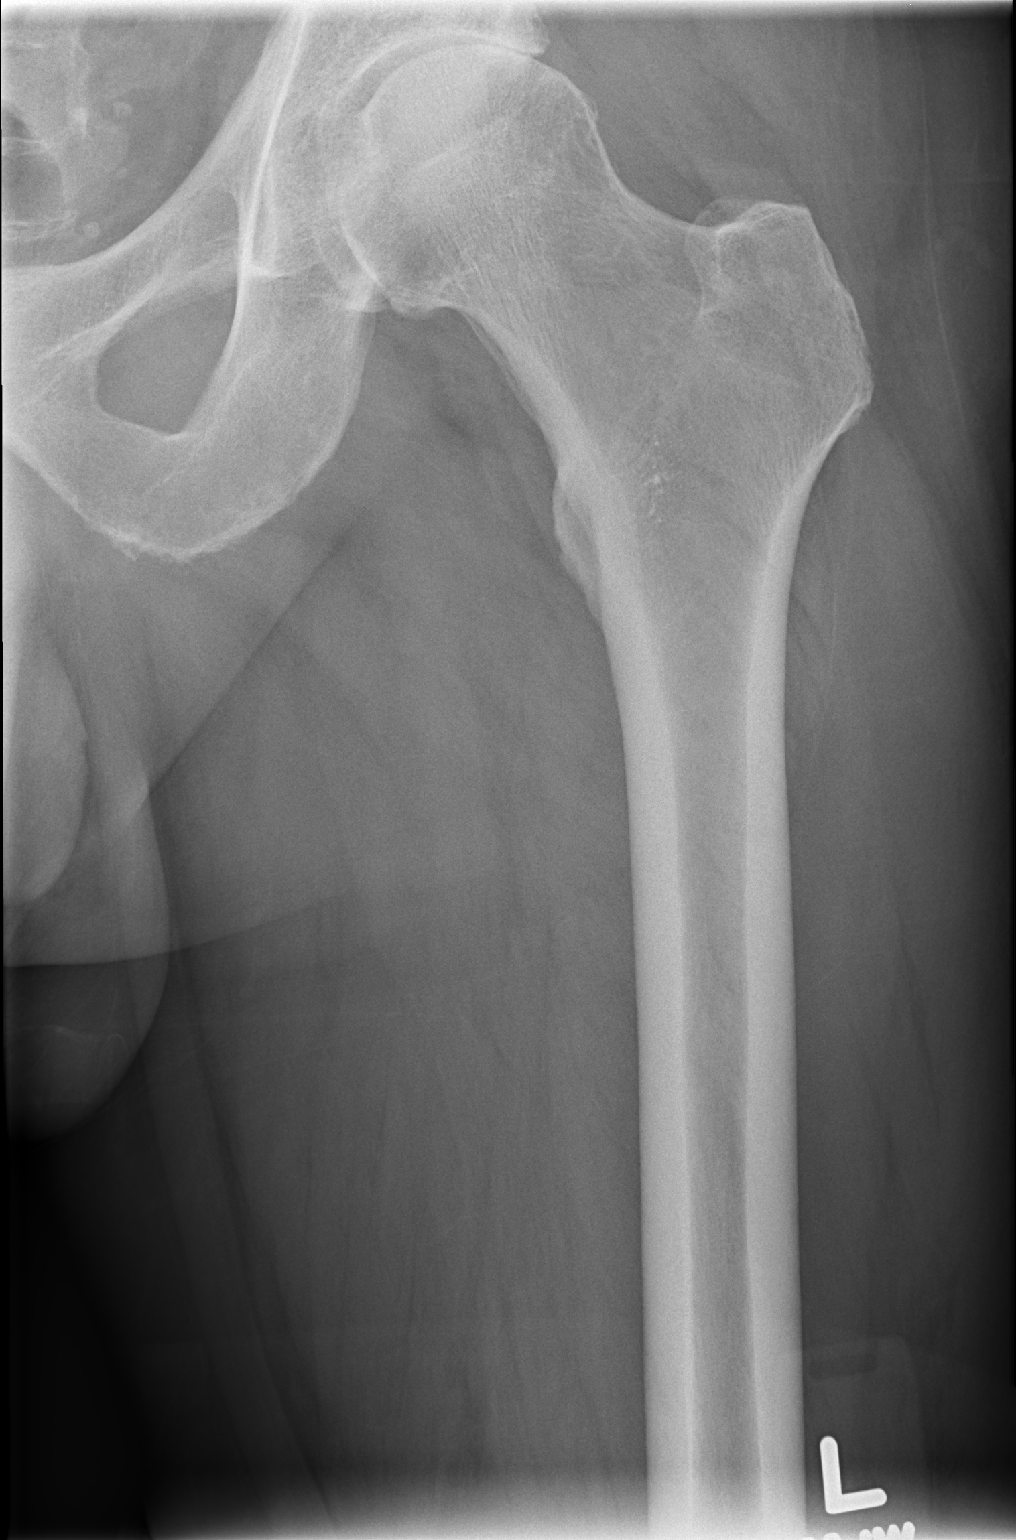

[t hip frog leg left]
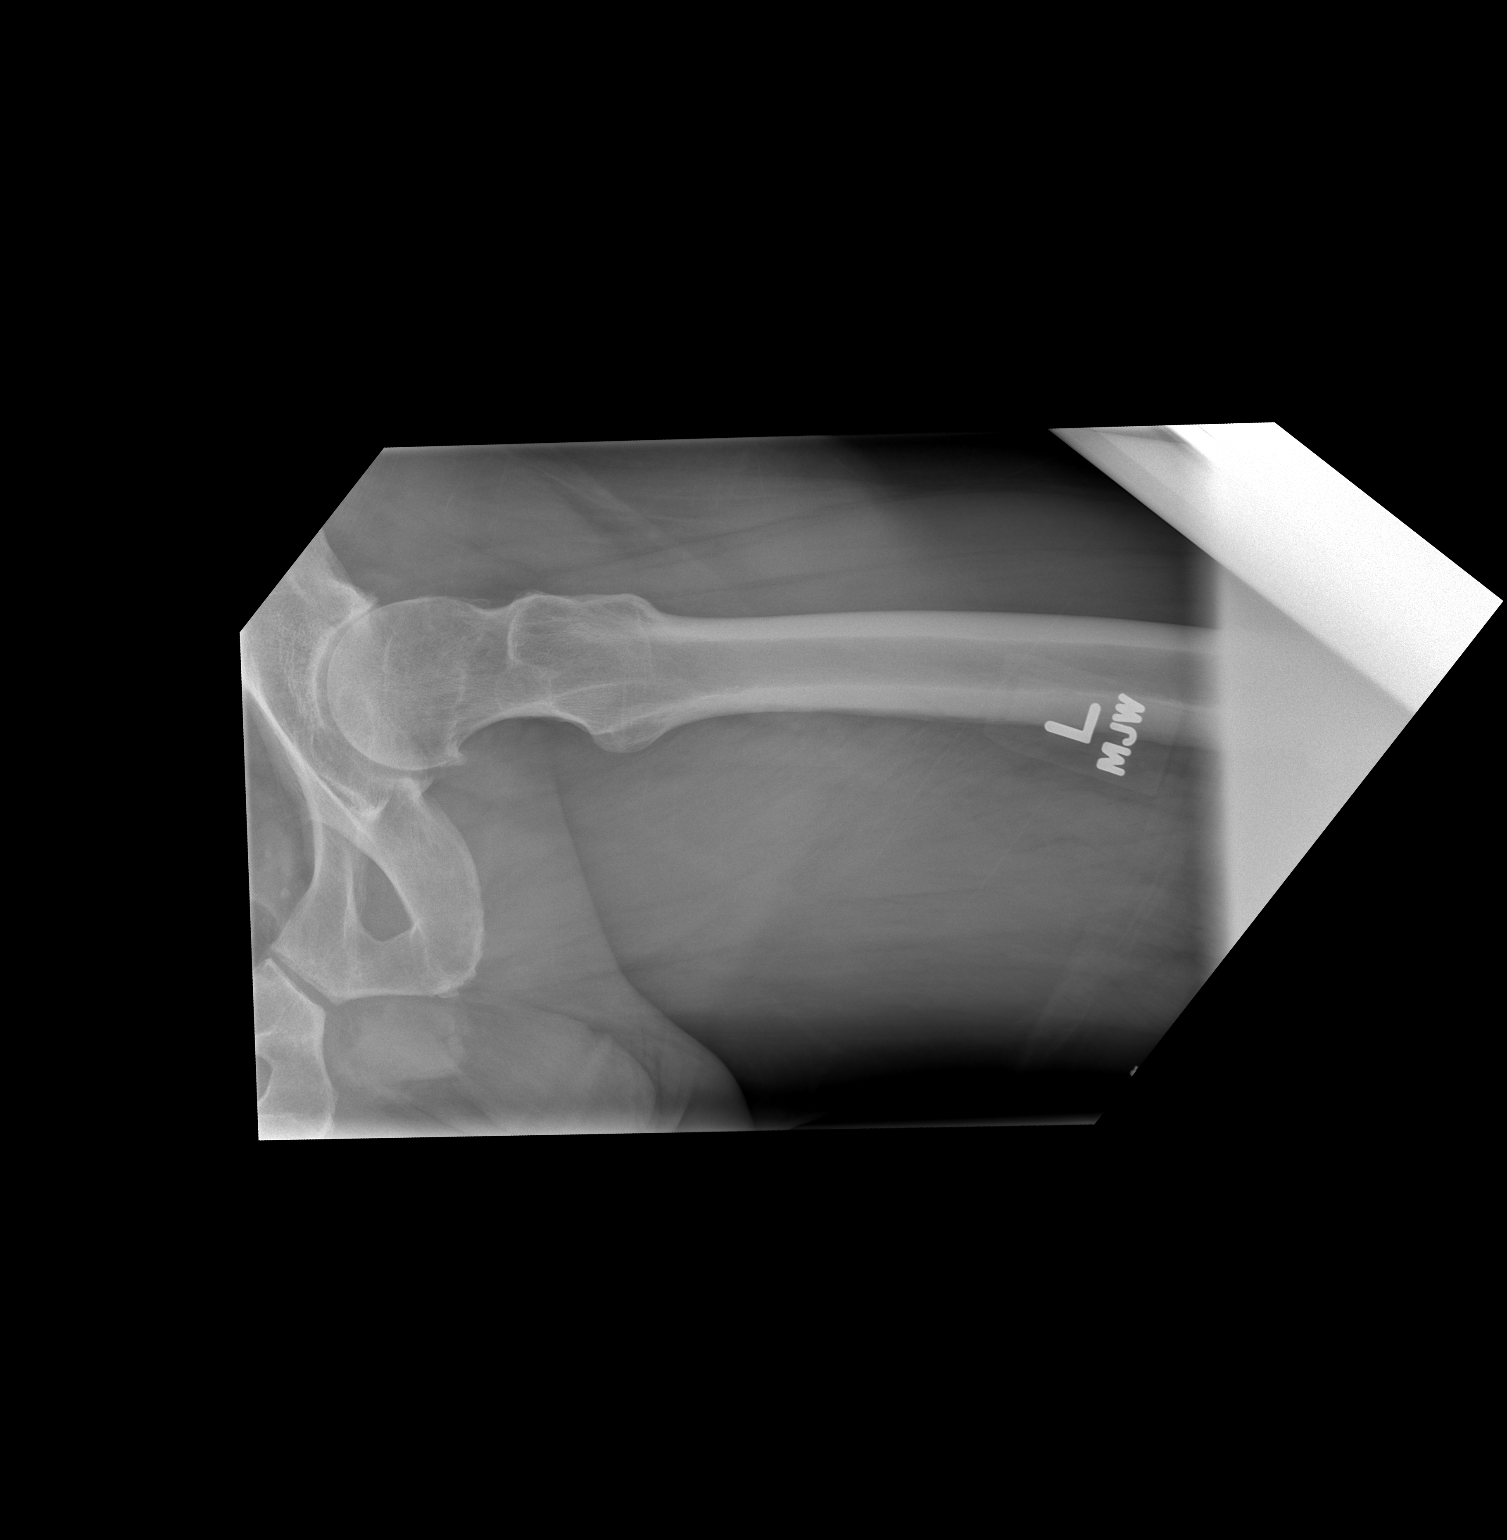

[3 of 3 positions shown; findings below may reference images not displayed]

FINDINGS: Moderate degenerative changes within the left hip with joint space
narrowing, early spurring and subchondral sclerosis. SI joints and
right hip joint are unremarkable. No acute bony abnormality.
Specifically, no fracture, subluxation, or dislocation. Soft tissues
are intact.
IMPRESSION: Moderate osteoarthritic changes within the left hip.

## 2015-09-27 ENCOUNTER — Ambulatory Visit (HOSPITAL_COMMUNITY)
Admission: EM | Admit: 2015-09-27 | Discharge: 2015-09-27 | Disposition: A | Discharge status: Home / Self Care | Payer: BLUE CROSS/BLUE SHIELD | Attending: Family Medicine | Admitting: Family Medicine

## 2015-09-27 ENCOUNTER — Encounter (HOSPITAL_COMMUNITY): Payer: Self-pay | Admitting: Emergency Medicine

## 2015-09-27 DIAGNOSIS — M7552 Bursitis of left shoulder: Secondary | ICD-10-CM | POA: Diagnosis not present

## 2015-09-27 MED ORDER — BUPIVACAINE HCL (PF) 0.5 % IJ SOLN
INTRAMUSCULAR | Status: AC
  Filled 2015-09-27: qty 10

## 2015-09-27 MED ORDER — TRIAMCINOLONE ACETONIDE 40 MG/ML IJ SUSP
INTRAMUSCULAR | Status: AC
  Filled 2015-09-27: qty 3

## 2015-09-27 MED ORDER — TRIAMCINOLONE ACETONIDE 40 MG/ML IJ SUSP
INTRAMUSCULAR | Status: AC
  Filled 2015-09-27: qty 1

## 2015-09-27 NOTE — ED Provider Notes (Signed)
CSN: [id number]     Arrival date & time 09/27/15  1035 History   [id number] MD Initiated Contact with Patient 09/27/15 1049     No chief complaint on file.  (Consider location/radiation/quality/duration/timing/severity/associated sxs/prior Treatment) Patient is a 60 y.o. male presenting with shoulder pain. The history is provided by the patient.  Shoulder Pain Location:  Shoulder Time since incident:  6 months Injury: no   Shoulder location:  L shoulder and R shoulder Pain details:    Quality:  Sharp   Radiates to:  Does not radiate   Severity:  Moderate   Onset quality:  Gradual Chronicity:  Chronic Dislocation: no   Foreign body present:  No foreign bodies Prior injury to area:  No Relieved by:  None tried Worsened by:  Nothing tried Ineffective treatments:  None tried Associated symptoms: decreased range of motion and stiffness   Associated symptoms: no neck pain     Past Medical History  Diagnosis Date  . Hypertension     takes Diovan daily  . Hyperlipidemia     borderline but has lost weight and eating better;takes Fish oil  . Weakness     numbness and tingling related to shoulder problems  . Arthritis   . Joint pain   . Back pain   . H/O hiatal hernia   . History of shingles    Past Surgical History  Procedure Laterality Date  . Shoulder surgery Left   . Arthroscopic repair acl Left   . Elbow surgery Right   . Tonsillectomy    . Adenoidectomy    . Colonoscopy    . Total hip arthroplasty Left 09/18/2013    Procedure: LEFT TOTAL HIP ARTHROPLASTY ANTERIOR APPROACH;  Surgeon: Loanne DrillingFrank Aluisio V, MD;  Location: [facility] OR;  Service: Orthopedics;  Laterality: Left;   No family history on file. Social History  Substance Use Topics  . Smoking status: Never Smoker   . Smokeless tobacco: Not on file  . Alcohol Use: Yes     Comment: weekly in the summer    Review of Systems  Constitutional: Negative.   Musculoskeletal: Positive for joint swelling and stiffness. Negative  for neck pain and neck stiffness.  All other systems reviewed and are negative.   Allergies  Penicillins  Home Medications   Prior to Admission medications   Medication Sig Start Date End Date Taking? Authorizing Provider  methocarbamol (ROBAXIN) 500 MG tablet Take 1 tablet (500 mg total) by mouth every 6 (six) hours as needed for muscle spasms. 09/18/13   Ollen GrossFrank Aluisio, MD  oxyCODONE (OXY IR/ROXICODONE) 5 MG immediate release tablet Take 1-2 tablets (5-10 mg total) by mouth every 3 (three) hours as needed for breakthrough pain. 09/18/13   Ollen GrossFrank Aluisio, MD  rivaroxaban (XARELTO) 10 MG TABS tablet Take 1 tablet (10 mg total) by mouth daily with breakfast. 09/19/13   Ollen GrossFrank Aluisio, MD  traMADol (ULTRAM) 50 MG tablet Take 1-2 tablets (50-100 mg total) by mouth every 6 (six) hours as needed for moderate pain. 09/18/13   Ollen GrossFrank Aluisio, MD  valsartan-hydrochlorothiazide (DIOVAN-HCT) 160-25 MG per tablet Take 1 tablet by mouth daily with breakfast.    Historical Provider, MD   Meds Ordered and Administered this Visit  Medications - No data to display  BP 114/79 mmHg  Pulse 66  Temp(Src) 97.4 F (36.3 C) (Oral)  Resp 18  SpO2 95% No data found.   Physical Exam  Constitutional: He is oriented to person, place, and time. He appears well-developed and  well-nourished.  Musculoskeletal: He exhibits tenderness.       Right shoulder: He exhibits decreased range of motion, tenderness, effusion and crepitus. He exhibits no deformity, normal pulse and normal strength.       Left shoulder: He exhibits decreased range of motion, tenderness, effusion, crepitus and pain. He exhibits normal pulse and normal strength.  Neurological: He is alert and oriented to person, place, and time.  Skin: Skin is warm and dry.  Nursing note and vitals reviewed.   ED Course  .Joint Aspiration/Arthrocentesis Date/Time: 09/27/2015 11:05 AM Performed by: Linna HoffKINDL, Nychelle Cassata D Authorized by: Bradd CanaryKINDL, Elese Rane D Consent: Verbal  consent obtained. Consent given by: patient Indications: pain  Body area: shoulder Joint: left shoulder Local anesthesia used: no Patient sedated: no Preparation: Patient was prepped and draped in the usual sterile fashion. Needle gauge: 22 G Triamcinolone amount: 80 mg Bupivacaine 0.50% amount: 3 ml Patient tolerance: Patient tolerated the procedure well with no immediate complications   (including critical care time)  Labs Review Labs Reviewed - No data to display  Imaging Review No results found.   Visual Acuity Review  Right Eye Distance:   Left Eye Distance:   Bilateral Distance:    Right Eye Near:   Left Eye Near:    Bilateral Near:         MDM  No diagnosis found.     Linna HoffJames D Carold Eisner, MD 09/27/15 [phone]

## 2015-09-27 NOTE — ED Notes (Signed)
Patient reports chronic bilateral shoulder pain.  Patient also mentions dry, itchy skin to back of neck. Area is exposed to a lot of sun

## 2015-09-27 NOTE — ED Notes (Signed)
Marcaine 3 cc and kenalog 2cc prepared in one syringe for dr Artis Flockkindl.  Prepared 2 identical syringes for dr Artis Flockkindl.

## 2019-05-11 NOTE — H&P (Signed)
Patient's anticipated LOS is less than 2 midnights, meeting these requirements: - Younger than 70 - Lives within 1 hour of care - Has a competent adult at home to recover with post-op recover - NO history of  - Chronic pain requiring opiods  - Diabetes  - Coronary Artery Disease  - Heart failure  - Heart attack  - Stroke  - DVT/VTE  - Cardiac arrhythmia  - Respiratory Failure/COPD  - Renal failure  - Anemia  - Advanced Liver disease       Johnny Gonzalez is an 64 y.o. male.    Chief Complaint: left shoulder pain  HPI: Pt is a 64 y.o. male complaining of left shoulder pain for multiple years. Pain had continually increased since the beginning. X-rays in the clinic show end-stage arthritic changes of the left shoulder. Pt has tried various conservative treatments which have failed to alleviate their symptoms, including injections and therapy. Various options are discussed with the patient. Risks, benefits and expectations were discussed with the patient. Patient understand the risks, benefits and expectations and wishes to proceed with surgery.   PCP:  Albertina Senegal, MD  D/C Plans: Home  PMH: Past Medical History:  Diagnosis Date  . Arthritis   . Back pain   . H/O hiatal hernia   . History of shingles   . Hyperlipidemia    borderline but has lost weight and eating better;takes Fish oil  . Hypertension    takes Diovan daily  . Joint pain   . Weakness    numbness and tingling related to shoulder problems    PSH: Past Surgical History:  Procedure Laterality Date  . ADENOIDECTOMY    . ARTHROSCOPIC REPAIR ACL Left   . COLONOSCOPY    . ELBOW SURGERY Right   . SHOULDER SURGERY Left   . TONSILLECTOMY    . TOTAL HIP ARTHROPLASTY Left 09/18/2013   Procedure: LEFT TOTAL HIP ARTHROPLASTY ANTERIOR APPROACH;  Surgeon: Loanne Drilling, MD;  Location: [facility] OR;  Service: Orthopedics;  Laterality: Left;    Social History:  reports that he has never smoked. He does not have  any smokeless tobacco history on file. He reports current alcohol use. He reports that he does not use drugs.  Allergies:  Allergies  Allergen Reactions  . Penicillins Other (See Comments)    Childhood allergy. Did it involve swelling of the face/tongue/throat, SOB, or low BP? Unknown Did it involve sudden or severe rash/hives, skin peeling, or any reaction on the inside of your mouth or nose? Unknown Did you need to seek medical attention at a hospital or doctor's office? Unknown When did it last happen? If all above answers are "NO", may proceed with cephalosporin use.     Medications: No current facility-administered medications for this encounter.   Current Outpatient Medications  Medication Sig Dispense Refill  . acetaminophen (TYLENOL) 500 MG tablet Take 1,000 mg by mouth every 6 (six) hours as needed for moderate pain.    . calcium carbonate (TUMS - DOSED IN MG ELEMENTAL CALCIUM) 500 MG chewable tablet Chew 2 tablets by mouth 3 (three) times daily as needed for indigestion or heartburn.    . telmisartan-hydrochlorothiazide (MICARDIS HCT) 80-25 MG tablet Take 1 tablet by mouth daily.    . methocarbamol (ROBAXIN) 500 MG tablet Take 1 tablet (500 mg total) by mouth every 6 (six) hours as needed for muscle spasms. (Patient not taking: Reported on 05/05/2019) 60 tablet 1  . oxyCODONE (OXY IR/ROXICODONE) 5 MG immediate  release tablet Take 1-2 tablets (5-10 mg total) by mouth every 3 (three) hours as needed for breakthrough pain. (Patient not taking: Reported on 05/05/2019) 80 tablet 0  . rivaroxaban (XARELTO) 10 MG TABS tablet Take 1 tablet (10 mg total) by mouth daily with breakfast. (Patient not taking: Reported on 05/05/2019) 20 tablet 0  . traMADol (ULTRAM) 50 MG tablet Take 1-2 tablets (50-100 mg total) by mouth every 6 (six) hours as needed for moderate pain. (Patient not taking: Reported on 05/05/2019) 90 tablet 1    No results found for this or any previous visit (from the past 48  hour(s)). No results found.  ROS: Pain with rom of the left upper extremity  Physical Exam: Alert and oriented 64 y.o. male in no acute distress Cranial nerves 2-12 intact Cervical spine: full rom with no tenderness, nv intact distally Chest: active breath sounds bilaterally, no wheeze rhonchi or rales Heart: regular rate and rhythm, no murmur Abd: non tender non distended with active bowel sounds Hip is stable with rom  Left shoulder pain with rom nv intact distally No rashes or edema  Assessment/Plan Assessment: left shoulder end stage osteoarthritis  Plan:  Patient will undergo a left total shoulder by Dr. Veverly Fells at [facility] Risks benefits and expectations were discussed with the patient. Patient understand risks, benefits and expectations and wishes to proceed. Preoperative templating of the joint replacement has been completed, documented, and submitted to the Operating Room personnel in order to optimize intra-operative equipment management.   Merla Riches PA-C, [facility] is now [employer] [address], [place], [place], [place] [address] Phone: [phone] [email or web address] Facebook  [employer]

## 2019-05-11 NOTE — Patient Instructions (Signed)
DUE TO COVID-19 ONLY ONE VISITOR IS ALLOWED TO COME WITH YOU AND STAY IN THE WAITING ROOM ONLY DURING PRE OP AND PROCEDURE DAY OF SURGERY. THE 1 VISITOR MAY VISIT WITH YOU AFTER SURGERY IN YOUR PRIVATE ROOM DURING VISITING HOURS ONLY!  YOU NEED TO HAVE A COVID 19 TEST ON: 05/12/19 @ 10:35 AM_, THIS TEST MUST BE DONE BEFORE SURGERY, COME  [address], [place] , [address].  [facility]) ONCE YOUR COVID TEST IS COMPLETED, PLEASE BEGIN THE QUARANTINE INSTRUCTIONS AS OUTLINED IN YOUR HANDOUT.                Lauralyn Primes     Your procedure is scheduled on: 05/15/19   Report to [facility] Main  Entrance   Report to admitting at: 10:00 AM     Call this number if you have problems the morning of surgery [phone]    Remember:   BRUSH YOUR TEETH MORNING OF SURGERY AND RINSE YOUR MOUTH OUT, NO CHEWING GUM CANDY OR MINTS.     Take these medicines the morning of surgery with A SIP OF WATER: N/A                                 You may not have any metal on your body including hair pins and              piercings  Do not wear jewelry, lotions, powders or perfumes, deodorant             Men may shave face and neck.   Do not bring valuables to the hospital. [facility] IS NOT             RESPONSIBLE   FOR VALUABLES.  Contacts, dentures or bridgework may not be worn into surgery.  Leave suitcase in the car. After surgery it may be brought to your room.     Patients discharged the day of surgery will not be allowed to drive home. IF YOU ARE HAVING SURGERY AND GOING HOME THE SAME DAY, YOU MUST HAVE AN ADULT TO DRIVE YOU HOME AND BE WITH YOU FOR 24 HOURS. YOU MAY GO HOME BY TAXI OR UBER OR ORTHERWISE, BUT AN ADULT MUST ACCOMPANY YOU HOME AND STAY WITH YOU FOR 24 HOURS.  Name and phone number of your driver:  Special Instructions: N/A              Please read over the following fact sheets you were  given: _____________________________________________________________________             NO SOLID FOOD AFTER MIDNIGHT THE NIGHT PRIOR TO SURGERY. NOTHING BY MOUTH EXCEPT CLEAR LIQUIDS UNTIL: 9:30 AM . PLEASE FINISH ENSURE DRINK PER SURGEON ORDER  WHICH NEEDS TO BE COMPLETED AT: 9:30 AM .   CLEAR LIQUID DIET   Foods Allowed                                                                     Foods Excluded  Coffee and tea, regular and decaf  liquids that you cannot  Plain Jell-O any favor except red or purple                                           see through such as: Fruit ices (not with fruit pulp)                                     milk, soups, orange juice  Iced Popsicles                                    All solid food Carbonated beverages, regular and diet                                    Cranberry, grape and apple juices Sports drinks like Gatorade Lightly seasoned clear broth or consume(fat free) Sugar, honey syrup  Sample Menu Breakfast                                Lunch                                     Supper Cranberry juice                    Beef broth                            Chicken broth Jell-O                                     Grape juice                           Apple juice Coffee or tea                        Jell-O                                      Popsicle                                                Coffee or tea                        Coffee or tea  _____________________________________________________________________  [facility]- Preparing for Total Shoulder Arthroplasty    Before surgery, you can play an important role. Because skin is not sterile, your skin needs to be as free of germs as possible. You can reduce the number of germs on your skin by using the following products. . Benzoyl Peroxide Gel o Reduces the number of germs  present on the skin o Applied twice a day to shoulder area starting two days  before surgery    ==================================================================  Please follow these instructions carefully:  BENZOYL PEROXIDE 5% GEL  Please do not use if you have an allergy to benzoyl peroxide.   If your skin becomes reddened/irritated stop using the benzoyl peroxide.  Starting two days before surgery, apply as follows: 1. Apply benzoyl peroxide in the morning and at night. Apply after taking a shower. If you are not taking a shower clean entire shoulder front, back, and side along with the armpit with a clean wet washcloth.  2. Place a quarter-sized dollop on your shoulder and rub in thoroughly, making sure to cover the front, back, and side of your shoulder, along with the armpit.   2 days before ____ AM   ____ PM              1 day before ____ AM   ____ PM                         3. Do this twice a day for two days.  (Last application is the night before surgery, AFTER using the CHG soap as described below).  4. Do NOT apply benzoyl peroxide gel on the day of surgery.   [place] [facility] - Preparing for Surgery Before surgery, you can play an important role.  Because skin is not sterile, your skin needs to be as free of germs as possible.  You can reduce the number of germs on your skin by washing with CHG (chlorahexidine gluconate) soap before surgery.  CHG is an antiseptic cleaner which kills germs and bonds with the skin to continue killing germs even after washing. Please DO NOT use if you have an allergy to CHG or antibacterial soaps.  If your skin becomes reddened/irritated stop using the CHG and inform your nurse when you arrive at Short Stay. Do not shave (including legs and underarms) for at least 48 hours prior to the first CHG shower.  You may shave your face/neck. Please follow these instructions carefully:  1.  Shower with CHG Soap the night before surgery and the  morning of Surgery.  2.  If you choose to wash your hair, wash your hair first as usual  with your  normal  shampoo.  3.  After you shampoo, rinse your hair and body thoroughly to remove the  shampoo.                           4.  Use CHG as you would any other liquid soap.  You can apply chg directly  to the skin and wash                       Gently with a scrungie or clean washcloth.  5.  Apply the CHG Soap to your body ONLY FROM THE NECK DOWN.   Do not use on face/ open                           Wound or open sores. Avoid contact with eyes, ears mouth and genitals (private parts).                       Wash face,  Genitals (private parts) with your normal soap.  6.  Wash thoroughly, paying special attention to the area where your surgery  will be performed.  7.  Thoroughly rinse your body with warm water from the neck down.  8.  DO NOT shower/wash with your normal soap after using and rinsing off  the CHG Soap.                9.  Pat yourself dry with a clean towel.            10.  Wear clean pajamas.            11.  Place clean sheets on your bed the night of your first shower and do not  sleep with pets. Day of Surgery : Do not apply any lotions/deodorants the morning of surgery.  Please wear clean clothes to the hospital/surgery center.  FAILURE TO FOLLOW THESE INSTRUCTIONS MAY RESULT IN THE CANCELLATION OF YOUR SURGERY PATIENT SIGNATURE_________________________________  NURSE SIGNATURE__________________________________  ________________________________________________________________________   Adam Phenix  An incentive spirometer is a tool that can help keep your lungs clear and active. This tool measures how well you are filling your lungs with each breath. Taking long deep breaths may help reverse or decrease the chance of developing breathing (pulmonary) problems (especially infection) following:  A long period of time when you are unable to move or be active. BEFORE THE PROCEDURE   If the spirometer includes an indicator to show your best  effort, your nurse or respiratory therapist will set it to a desired goal.  If possible, sit up straight or lean slightly forward. Try not to slouch.  Hold the incentive spirometer in an upright position. INSTRUCTIONS FOR USE  1. Sit on the edge of your bed if possible, or sit up as far as you can in bed or on a chair. 2. Hold the incentive spirometer in an upright position. 3. Breathe out normally. 4. Place the mouthpiece in your mouth and seal your lips tightly around it. 5. Breathe in slowly and as deeply as possible, raising the piston or the ball toward the top of the column. 6. Hold your breath for 3-5 seconds or for as long as possible. Allow the piston or ball to fall to the bottom of the column. 7. Remove the mouthpiece from your mouth and breathe out normally. 8. Rest for a few seconds and repeat Steps 1 through 7 at least 10 times every 1-2 hours when you are awake. Take your time and take a few normal breaths between deep breaths. 9. The spirometer may include an indicator to show your best effort. Use the indicator as a goal to work toward during each repetition. 10. After each set of 10 deep breaths, practice coughing to be sure your lungs are clear. If you have an incision (the cut made at the time of surgery), support your incision when coughing by placing a pillow or rolled up towels firmly against it. Once you are able to get out of bed, walk around indoors and cough well. You may stop using the incentive spirometer when instructed by your caregiver.  RISKS AND COMPLICATIONS  Take your time so you do not get dizzy or light-headed.  If you are in pain, you may need to take or ask for pain medication before doing incentive spirometry. It is harder to take a deep breath if you are having pain. AFTER USE  Rest and breathe slowly and easily.  It can be helpful to keep track of a log of your  progress. Your caregiver can provide you with a simple table to help with this. If you  are using the spirometer at home, follow these instructions: SEEK MEDICAL CARE IF:   You are having difficultly using the spirometer.  You have trouble using the spirometer as often as instructed.  Your pain medication is not giving enough relief while using the spirometer.  You develop fever of 100.5 F (38.1 C) or higher. SEEK IMMEDIATE MEDICAL CARE IF:   You cough up bloody sputum that had not been present before.  You develop fever of 102 F (38.9 C) or greater.  You develop worsening pain at or near the incision site. MAKE SURE YOU:   Understand these instructions.  Will watch your condition.  Will get help right away if you are not doing well or get worse. Document Released: 07/23/2006 Document Revised: 06/04/2011 Document Reviewed: 09/23/2006 [facility] Patient Information 2014 [place], [place].   ________________________________________________________________________

## 2019-05-12 ENCOUNTER — Encounter (HOSPITAL_COMMUNITY)
Admission: RE | Admit: 2019-05-12 | Discharge: 2019-05-12 | Disposition: A | Discharge status: Home / Self Care | Payer: 59 | Source: Ambulatory Visit | Attending: Orthopedic Surgery | Admitting: Orthopedic Surgery

## 2019-05-12 ENCOUNTER — Other Ambulatory Visit: Payer: Self-pay

## 2019-05-12 ENCOUNTER — Encounter (HOSPITAL_COMMUNITY): Payer: Self-pay

## 2019-05-12 ENCOUNTER — Other Ambulatory Visit (HOSPITAL_COMMUNITY)
Admission: RE | Admit: 2019-05-12 | Discharge: 2019-05-12 | Disposition: A | Discharge status: Home / Self Care | Payer: 59 | Source: Ambulatory Visit | Attending: Orthopedic Surgery | Admitting: Orthopedic Surgery

## 2019-05-12 DIAGNOSIS — Z01810 Encounter for preprocedural cardiovascular examination: Secondary | ICD-10-CM | POA: Insufficient documentation

## 2019-05-12 DIAGNOSIS — Z20822 Contact with and (suspected) exposure to covid-19: Secondary | ICD-10-CM | POA: Insufficient documentation

## 2019-05-12 DIAGNOSIS — Z01812 Encounter for preprocedural laboratory examination: Secondary | ICD-10-CM | POA: Insufficient documentation

## 2019-05-12 LAB — BASIC METABOLIC PANEL
Anion gap: 9 (ref 5–15)
BUN: 19 mg/dL (ref 8–23)
CO2: 29 mmol/L (ref 22–32)
Calcium: 9.4 mg/dL (ref 8.9–10.3)
Chloride: 99 mmol/L (ref 98–111)
Creatinine, Ser: 1.17 mg/dL (ref 0.61–1.24)
GFR calc Af Amer: >60 mL/min (ref >60)
GFR calc non Af Amer: >60 mL/min (ref >60)
Glucose, Bld: 96 mg/dL (ref 70–99)
Potassium: 4.4 mmol/L (ref 3.5–5.1)
Sodium: 137 mmol/L (ref 135–145)

## 2019-05-12 LAB — CBC
HCT: 43.1 % (ref 39.0–52.0)
Hemoglobin: 14.1 g/dL (ref 13.0–17.0)
MCH: 29.7 pg (ref 26.0–34.0)
MCHC: 32.7 g/dL (ref 30.0–36.0)
MCV: 90.7 fL (ref 80.0–100.0)
Platelets: 227 10*3/uL (ref 150–400)
RBC: 4.75 MIL/uL (ref 4.22–5.81)
RDW: 13.8 % (ref 11.5–15.5)
WBC: 9.5 10*3/uL (ref 4.0–10.5)
nRBC: 0 % (ref 0.0–0.2)

## 2019-05-12 LAB — SARS CORONAVIRUS 2 (TAT 6-24 HRS): SARS Coronavirus 2: NEGATIVE

## 2019-05-12 LAB — SURGICAL PCR SCREEN
MRSA, PCR: NEGATIVE
Staphylococcus aureus: NEGATIVE

## 2019-05-12 NOTE — Progress Notes (Signed)
PCP -  Dr. Albertina Senegal. LOV: 04/21/19 Cardiologist -   Chest x-ray -  EKG -  Stress Test -  ECHO -  Cardiac Cath -   Sleep Study -  CPAP -   Fasting Blood Sugar -  Checks Blood Sugar _____ times a day  Blood Thinner Instructions: Aspirin Instructions: Last Dose:  Anesthesia review:   Patient denies shortness of breath, fever, cough and chest pain at PAT appointment   Patient verbalized understanding of instructions that were given to them at the PAT appointment. Patient was also instructed that they will need to review over the PAT instructions again at home before surgery.

## 2019-05-15 ENCOUNTER — Ambulatory Visit (HOSPITAL_COMMUNITY): Payer: 59 | Admitting: Physician Assistant

## 2019-05-15 ENCOUNTER — Encounter (HOSPITAL_COMMUNITY): Payer: Self-pay | Admitting: Orthopedic Surgery

## 2019-05-15 ENCOUNTER — Other Ambulatory Visit: Payer: Self-pay

## 2019-05-15 ENCOUNTER — Observation Stay (HOSPITAL_COMMUNITY)
Admission: AD | Admit: 2019-05-15 | Discharge: 2019-05-16 | Disposition: A | Discharge status: Home / Self Care | Payer: 59 | Source: Ambulatory Visit | Attending: Orthopedic Surgery | Admitting: Orthopedic Surgery

## 2019-05-15 ENCOUNTER — Encounter (HOSPITAL_COMMUNITY)
Admission: AD | Disposition: A | Discharge status: Home / Self Care | Payer: Self-pay | Source: Ambulatory Visit | Attending: Orthopedic Surgery

## 2019-05-15 ENCOUNTER — Inpatient Hospital Stay (HOSPITAL_COMMUNITY): Payer: 59

## 2019-05-15 ENCOUNTER — Ambulatory Visit (HOSPITAL_COMMUNITY): Payer: 59 | Admitting: Anesthesiology

## 2019-05-15 DIAGNOSIS — Z96612 Presence of left artificial shoulder joint: Secondary | ICD-10-CM

## 2019-05-15 DIAGNOSIS — Z6830 Body mass index (BMI) 30.0-30.9, adult: Secondary | ICD-10-CM | POA: Diagnosis not present

## 2019-05-15 DIAGNOSIS — M71312 Other bursal cyst, left shoulder: Secondary | ICD-10-CM | POA: Diagnosis not present

## 2019-05-15 DIAGNOSIS — I10 Essential (primary) hypertension: Secondary | ICD-10-CM | POA: Diagnosis not present

## 2019-05-15 DIAGNOSIS — Z96642 Presence of left artificial hip joint: Secondary | ICD-10-CM | POA: Diagnosis not present

## 2019-05-15 DIAGNOSIS — M67412 Ganglion, left shoulder: Secondary | ICD-10-CM | POA: Diagnosis not present

## 2019-05-15 DIAGNOSIS — Z79899 Other long term (current) drug therapy: Secondary | ICD-10-CM | POA: Insufficient documentation

## 2019-05-15 DIAGNOSIS — M19012 Primary osteoarthritis, left shoulder: Principal | ICD-10-CM | POA: Insufficient documentation

## 2019-05-15 DIAGNOSIS — E669 Obesity, unspecified: Secondary | ICD-10-CM | POA: Diagnosis not present

## 2019-05-15 HISTORY — PX: TOTAL SHOULDER ARTHROPLASTY: SHX126

## 2019-05-15 SURGERY — ARTHROPLASTY, SHOULDER, TOTAL
Anesthesia: General | Site: Shoulder | Laterality: Left

## 2019-05-15 MED ORDER — SUCCINYLCHOLINE CHLORIDE 200 MG/10ML IV SOSY
PREFILLED_SYRINGE | INTRAVENOUS | Status: DC | PRN
  Administered 2019-05-15: 120 mg via INTRAVENOUS

## 2019-05-15 MED ORDER — FENTANYL CITRATE (PF) 100 MCG/2ML IJ SOLN
INTRAMUSCULAR | Status: DC | PRN
  Administered 2019-05-15 (×4): 50 ug via INTRAVENOUS

## 2019-05-15 MED ORDER — ONDANSETRON HCL 4 MG PO TABS: 4.0000 mg | ORAL_TABLET | Freq: Four times a day (QID) | ORAL | Status: DC | PRN

## 2019-05-15 MED ORDER — EPHEDRINE 5 MG/ML INJ
INTRAVENOUS | Status: AC
  Filled 2019-05-15: qty 10

## 2019-05-15 MED ORDER — PROPOFOL 10 MG/ML IV BOLUS
INTRAVENOUS | Status: AC
  Filled 2019-05-15: qty 20

## 2019-05-15 MED ORDER — PHENYLEPHRINE HCL (PRESSORS) 10 MG/ML IV SOLN
INTRAVENOUS | Status: AC
  Filled 2019-05-15: qty 1

## 2019-05-15 MED ORDER — DEXAMETHASONE SODIUM PHOSPHATE 10 MG/ML IJ SOLN
INTRAMUSCULAR | Status: DC | PRN
  Administered 2019-05-15: 8 mg via INTRAVENOUS

## 2019-05-15 MED ORDER — FENTANYL CITRATE (PF) 100 MCG/2ML IJ SOLN: 2.0000 ug | Freq: Once | INTRAMUSCULAR | Status: DC

## 2019-05-15 MED ORDER — ACETAMINOPHEN 325 MG PO TABS: 325.0000 mg | ORAL_TABLET | Freq: Four times a day (QID) | ORAL | Status: DC | PRN

## 2019-05-15 MED ORDER — BISACODYL 10 MG RE SUPP: 10.0000 mg | Freq: Every day | RECTAL | Status: DC | PRN

## 2019-05-15 MED ORDER — DEXAMETHASONE SODIUM PHOSPHATE 10 MG/ML IJ SOLN
INTRAMUSCULAR | Status: AC
  Filled 2019-05-15: qty 1

## 2019-05-15 MED ORDER — BUPIVACAINE HCL (PF) 0.25 % IJ SOLN
INTRAMUSCULAR | Status: DC | PRN
  Administered 2019-05-15: 11 mL

## 2019-05-15 MED ORDER — PHENOL 1.4 % MT LIQD
1.0000 | OROMUCOSAL | Status: DC | PRN
  Filled 2019-05-15: qty 177

## 2019-05-15 MED ORDER — FENTANYL CITRATE (PF) 100 MCG/2ML IJ SOLN
INTRAMUSCULAR | Status: AC
  Filled 2019-05-15: qty 2

## 2019-05-15 MED ORDER — METOCLOPRAMIDE HCL 5 MG PO TABS: 5.0000 mg | ORAL_TABLET | Freq: Three times a day (TID) | ORAL | Status: DC | PRN

## 2019-05-15 MED ORDER — LIDOCAINE 2% (20 MG/ML) 5 ML SYRINGE
INTRAMUSCULAR | Status: AC
  Filled 2019-05-15: qty 5

## 2019-05-15 MED ORDER — BUPIVACAINE LIPOSOME 1.3 % IJ SUSP: INTRAMUSCULAR | Status: DC | PRN

## 2019-05-15 MED ORDER — HYDROMORPHONE HCL 1 MG/ML IJ SOLN: 0.5000 mg | INTRAMUSCULAR | Status: DC | PRN

## 2019-05-15 MED ORDER — TRAMADOL HCL 50 MG PO TABS: 50.0000 mg | ORAL_TABLET | Freq: Four times a day (QID) | ORAL | 1 refills | Status: DC | PRN

## 2019-05-15 MED ORDER — CLINDAMYCIN PHOSPHATE 900 MG/50ML IV SOLN
900.0000 mg | INTRAVENOUS | Status: AC
  Administered 2019-05-15: 900 mg via INTRAVENOUS
  Filled 2019-05-15: qty 50

## 2019-05-15 MED ORDER — PROMETHAZINE HCL 25 MG/ML IJ SOLN: 6.2500 mg | INTRAMUSCULAR | Status: DC | PRN

## 2019-05-15 MED ORDER — PHENYLEPHRINE 40 MCG/ML (10ML) SYRINGE FOR IV PUSH (FOR BLOOD PRESSURE SUPPORT)
PREFILLED_SYRINGE | INTRAVENOUS | Status: DC | PRN
  Administered 2019-05-15: 120 ug via INTRAVENOUS
  Administered 2019-05-15: 80 ug via INTRAVENOUS
  Administered 2019-05-15: 120 ug via INTRAVENOUS

## 2019-05-15 MED ORDER — PROPOFOL 10 MG/ML IV BOLUS
INTRAVENOUS | Status: DC | PRN
  Administered 2019-05-15: 200 mg via INTRAVENOUS

## 2019-05-15 MED ORDER — TELMISARTAN-HCTZ 80-25 MG PO TABS: 1.0000 | ORAL_TABLET | Freq: Every day | ORAL | Status: DC

## 2019-05-15 MED ORDER — CALCIUM CARBONATE ANTACID 500 MG PO CHEW: 2.0000 | CHEWABLE_TABLET | Freq: Three times a day (TID) | ORAL | Status: DC | PRN

## 2019-05-15 MED ORDER — MENTHOL 3 MG MT LOZG: 1.0000 | LOZENGE | OROMUCOSAL | Status: DC | PRN

## 2019-05-15 MED ORDER — LACTATED RINGERS IV SOLN: INTRAVENOUS | Status: DC

## 2019-05-15 MED ORDER — OXYCODONE HCL 5 MG PO TABS
5.0000 mg | ORAL_TABLET | ORAL | Status: DC | PRN
  Administered 2019-05-16 (×2): 5 mg via ORAL
  Filled 2019-05-15 (×2): qty 1

## 2019-05-15 MED ORDER — ONDANSETRON HCL 4 MG/2ML IJ SOLN
INTRAMUSCULAR | Status: AC
  Filled 2019-05-15: qty 2

## 2019-05-15 MED ORDER — PHENYLEPHRINE HCL-NACL 10-0.9 MG/250ML-% IV SOLN
INTRAVENOUS | Status: DC | PRN
  Administered 2019-05-15: 40 ug/min via INTRAVENOUS

## 2019-05-15 MED ORDER — CLINDAMYCIN PHOSPHATE 600 MG/50ML IV SOLN
600.0000 mg | Freq: Four times a day (QID) | INTRAVENOUS | Status: AC
  Administered 2019-05-15 – 2019-05-16 (×3): 600 mg via INTRAVENOUS
  Filled 2019-05-15 (×3): qty 50

## 2019-05-15 MED ORDER — MIDAZOLAM HCL 2 MG/2ML IJ SOLN
2.0000 mg | Freq: Once | INTRAMUSCULAR | Status: AC
  Administered 2019-05-15: 2 mg via INTRAVENOUS

## 2019-05-15 MED ORDER — THROMBIN 5000 UNITS EX SOLR
CUTANEOUS | Status: DC | PRN
  Administered 2019-05-15: 5000 [IU] via TOPICAL

## 2019-05-15 MED ORDER — THROMBIN (RECOMBINANT) 5000 UNITS EX SOLR
CUTANEOUS | Status: AC
  Filled 2019-05-15: qty 5000

## 2019-05-15 MED ORDER — OXYCODONE HCL 5 MG PO TABS: 5.0000 mg | ORAL_TABLET | ORAL | 0 refills | Status: DC | PRN

## 2019-05-15 MED ORDER — SODIUM CHLORIDE 0.9 % IR SOLN
Status: DC | PRN
  Administered 2019-05-15: 1000 mL

## 2019-05-15 MED ORDER — ONDANSETRON HCL 4 MG/2ML IJ SOLN: 4.0000 mg | Freq: Four times a day (QID) | INTRAMUSCULAR | Status: DC | PRN

## 2019-05-15 MED ORDER — FENTANYL CITRATE (PF) 100 MCG/2ML IJ SOLN: 25.0000 ug | INTRAMUSCULAR | Status: DC | PRN

## 2019-05-15 MED ORDER — SUCCINYLCHOLINE CHLORIDE 200 MG/10ML IV SOSY
PREFILLED_SYRINGE | INTRAVENOUS | Status: AC
  Filled 2019-05-15: qty 10

## 2019-05-15 MED ORDER — ONDANSETRON HCL 4 MG/2ML IJ SOLN
INTRAMUSCULAR | Status: DC | PRN
  Administered 2019-05-15: 4 mg via INTRAVENOUS

## 2019-05-15 MED ORDER — CHLORHEXIDINE GLUCONATE 4 % EX LIQD: 60.0000 mL | Freq: Once | CUTANEOUS | Status: DC

## 2019-05-15 MED ORDER — FENTANYL CITRATE (PF) 100 MCG/2ML IJ SOLN
50.0000 ug | Freq: Once | INTRAMUSCULAR | Status: AC
  Administered 2019-05-15: 50 ug via INTRAVENOUS

## 2019-05-15 MED ORDER — HYDROCHLOROTHIAZIDE 25 MG PO TABS
25.0000 mg | ORAL_TABLET | Freq: Every day | ORAL | Status: DC
  Filled 2019-05-15: qty 1

## 2019-05-15 MED ORDER — SODIUM CHLORIDE 0.9 % IV SOLN: INTRAVENOUS | Status: DC

## 2019-05-15 MED ORDER — IRBESARTAN 150 MG PO TABS
300.0000 mg | ORAL_TABLET | Freq: Every day | ORAL | Status: DC
  Filled 2019-05-15: qty 2

## 2019-05-15 MED ORDER — ACETAMINOPHEN 500 MG PO TABS
1000.0000 mg | ORAL_TABLET | Freq: Once | ORAL | Status: AC
  Administered 2019-05-15: 1000 mg via ORAL
  Filled 2019-05-15: qty 2

## 2019-05-15 MED ORDER — METHOCARBAMOL 500 MG PO TABS: 500.0000 mg | ORAL_TABLET | Freq: Four times a day (QID) | ORAL | 1 refills | Status: DC | PRN

## 2019-05-15 MED ORDER — ACETAMINOPHEN 500 MG PO TABS: 1000.0000 mg | ORAL_TABLET | Freq: Four times a day (QID) | ORAL | Status: DC | PRN

## 2019-05-15 MED ORDER — TRAMADOL HCL 50 MG PO TABS
50.0000 mg | ORAL_TABLET | Freq: Four times a day (QID) | ORAL | Status: DC | PRN
  Administered 2019-05-15: 50 mg via ORAL
  Filled 2019-05-15: qty 1

## 2019-05-15 MED ORDER — EPHEDRINE SULFATE-NACL 50-0.9 MG/10ML-% IV SOSY
PREFILLED_SYRINGE | INTRAVENOUS | Status: DC | PRN
  Administered 2019-05-15: 5 mg via INTRAVENOUS

## 2019-05-15 MED ORDER — METOCLOPRAMIDE HCL 5 MG/ML IJ SOLN: 5.0000 mg | Freq: Three times a day (TID) | INTRAMUSCULAR | Status: DC | PRN

## 2019-05-15 MED ORDER — POLYETHYLENE GLYCOL 3350 17 G PO PACK: 17.0000 g | PACK | Freq: Every day | ORAL | Status: DC | PRN

## 2019-05-15 MED ORDER — BUPIVACAINE HCL (PF) 0.5 % IJ SOLN
INTRAMUSCULAR | Status: DC | PRN
  Administered 2019-05-15: 10 mL via PERINEURAL

## 2019-05-15 MED ORDER — BUPIVACAINE LIPOSOME 1.3 % IJ SUSP
INTRAMUSCULAR | Status: DC | PRN
  Administered 2019-05-15: 10 mL via PERINEURAL

## 2019-05-15 MED ORDER — LIDOCAINE 2% (20 MG/ML) 5 ML SYRINGE
INTRAMUSCULAR | Status: DC | PRN
  Administered 2019-05-15: 60 mg via INTRAVENOUS

## 2019-05-15 MED ORDER — PHENYLEPHRINE 40 MCG/ML (10ML) SYRINGE FOR IV PUSH (FOR BLOOD PRESSURE SUPPORT)
PREFILLED_SYRINGE | INTRAVENOUS | Status: AC
  Filled 2019-05-15: qty 10

## 2019-05-15 MED ORDER — BUPIVACAINE HCL (PF) 0.25 % IJ SOLN
INTRAMUSCULAR | Status: AC
  Filled 2019-05-15: qty 30

## 2019-05-15 MED ORDER — DOCUSATE SODIUM 100 MG PO CAPS
100.0000 mg | ORAL_CAPSULE | Freq: Two times a day (BID) | ORAL | Status: DC
  Administered 2019-05-15 – 2019-05-16 (×2): 100 mg via ORAL
  Filled 2019-05-15 (×2): qty 1

## 2019-05-15 MED ORDER — METHOCARBAMOL 500 MG PO TABS: 500.0000 mg | ORAL_TABLET | Freq: Four times a day (QID) | ORAL | Status: DC | PRN

## 2019-05-15 SURGICAL SUPPLY — 60 items
BAG ZIPLOCK 12X15 (MISCELLANEOUS) ×3 IMPLANT
BIT DRILL 1.6MX128 (BIT) ×2 IMPLANT
BIT DRILL 1.6MX128MM (BIT) ×1
BLADE SAG 18X100X1.27 (BLADE) ×3 IMPLANT
BODY UNITE ANATOMIC SZ12 (Miscellaneous) ×3 IMPLANT
CEMENT HV SMART SET (Cement) ×3 IMPLANT
CLOSURE WOUND 1/2 X4 (GAUZE/BANDAGES/DRESSINGS) ×1
COVER BACK TABLE 60X90IN (DRAPES) ×3 IMPLANT
COVER SURGICAL LIGHT HANDLE (MISCELLANEOUS) ×6 IMPLANT
COVER WAND RF STERILE (DRAPES) ×3 IMPLANT
DECANTER SPIKE VIAL GLASS SM (MISCELLANEOUS) IMPLANT
DRAPE INCISE IOBAN 66X45 STRL (DRAPES) ×3 IMPLANT
DRAPE ORTHO SPLIT 77X108 STRL (DRAPES) ×4
DRAPE SHEET LG 3/4 BI-LAMINATE (DRAPES) ×3 IMPLANT
DRAPE SURG ORHT 6 SPLT 77X108 (DRAPES) ×2 IMPLANT
DRAPE U-SHAPE 47X51 STRL (DRAPES) ×3 IMPLANT
DRSG ADAPTIC 3X8 NADH LF (GAUZE/BANDAGES/DRESSINGS) ×3 IMPLANT
DRSG PAD ABDOMINAL 8X10 ST (GAUZE/BANDAGES/DRESSINGS) ×3 IMPLANT
DURAPREP 26ML APPLICATOR (WOUND CARE) ×6 IMPLANT
ELECT BLADE TIP CTD 4 INCH (ELECTRODE) ×3 IMPLANT
ELECT NEEDLE TIP 2.8 STRL (NEEDLE) ×3 IMPLANT
ELECT REM PT RETURN 15FT ADLT (MISCELLANEOUS) ×3 IMPLANT
GAUZE SPONGE 4X4 12PLY STRL (GAUZE/BANDAGES/DRESSINGS) ×3 IMPLANT
GLENOID ANCHOR PEG CROSSLK 48 (Orthopedic Implant) ×3 IMPLANT
GLOVE BIOGEL PI ORTHO PRO 7.5 (GLOVE) ×4
GLOVE BIOGEL PI ORTHO PRO SZ8 (GLOVE) ×2
GLOVE ORTHO TXT STRL SZ7.5 (GLOVE) ×3 IMPLANT
GLOVE PI ORTHO PRO STRL 7.5 (GLOVE) ×2 IMPLANT
GLOVE PI ORTHO PRO STRL SZ8 (GLOVE) ×1 IMPLANT
GLOVE SURG ORTHO 8.5 STRL (GLOVE) ×9 IMPLANT
GOWN STRL REUS W/TWL XL LVL3 (GOWN DISPOSABLE) ×6 IMPLANT
HEAD HUMERAL ECCEN 52MX21M (Head) ×3 IMPLANT
KIT BASIN OR (CUSTOM PROCEDURE TRAY) ×3 IMPLANT
KIT TURNOVER KIT A (KITS) IMPLANT
MANIFOLD NEPTUNE II (INSTRUMENTS) ×3 IMPLANT
NEEDLE MAYO CATGUT SZ4 (NEEDLE) ×3 IMPLANT
PACK SHOULDER (CUSTOM PROCEDURE TRAY) ×3 IMPLANT
PASSER SUT SWANSON 36MM LOOP (INSTRUMENTS) IMPLANT
PENCIL SMOKE EVACUATOR (MISCELLANEOUS) IMPLANT
PIN METAGLENE 2.5 (PIN) ×6 IMPLANT
PROTECTOR NERVE ULNAR (MISCELLANEOUS) ×3 IMPLANT
RESTRAINT HEAD UNIVERSAL NS (MISCELLANEOUS) ×3 IMPLANT
SLING ARM FOAM STRAP LRG (SOFTGOODS) ×3 IMPLANT
SMARTMIX MINI TOWER (MISCELLANEOUS) ×3
SPONGE SURGIFOAM ABS GEL 12-7 (HEMOSTASIS) ×3 IMPLANT
STEM UNITE SZ12 (Stem) ×3 IMPLANT
STRIP CLOSURE SKIN 1/2X4 (GAUZE/BANDAGES/DRESSINGS) ×2 IMPLANT
SUCTION FRAZIER HANDLE 12FR (TUBING) ×2
SUCTION TUBE FRAZIER 12FR DISP (TUBING) ×1 IMPLANT
SUT FIBERWIRE #2 38 T-5 BLUE (SUTURE) ×15
SUT MNCRL AB 4-0 PS2 18 (SUTURE) ×3 IMPLANT
SUT VIC AB 0 CT1 36 (SUTURE) ×3 IMPLANT
SUT VIC AB 0 CT2 27 (SUTURE) ×3 IMPLANT
SUT VIC AB 2-0 CT1 27 (SUTURE) ×2
SUT VIC AB 2-0 CT1 TAPERPNT 27 (SUTURE) ×1 IMPLANT
SUTURE FIBERWR #2 38 T-5 BLUE (SUTURE) ×5 IMPLANT
TAPE CLOTH SURG 6X10 WHT LF (GAUZE/BANDAGES/DRESSINGS) ×3 IMPLANT
TOWEL OR 17X26 10 PK STRL BLUE (TOWEL DISPOSABLE) ×3 IMPLANT
TOWER SMARTMIX MINI (MISCELLANEOUS) ×1 IMPLANT
YANKAUER SUCT BULB TIP 10FT TU (MISCELLANEOUS) ×3 IMPLANT

## 2019-05-15 NOTE — Interval H&P Note (Signed)
History and Physical Interval Note:  05/15/2019 12:35 PM  Johnny Gonzalez  has presented today for surgery, with the diagnosis of Left shoulder osteoarthritis spinoglenoid notch cyst.  The various methods of treatment have been discussed with the patient and family. After consideration of risks, benefits and other options for treatment, the patient has consented to  Procedure(s): TOTAL SHOULDER ARTHROPLASTY (Left) as a surgical intervention.  The patient's history has been reviewed, patient examined, no change in status, stable for surgery.  I have reviewed the patient's chart and labs.  Questions were answered to the patient's satisfaction.     Verlee Rossetti

## 2019-05-15 NOTE — Discharge Instructions (Signed)
Ice to the shoulder constantly.  Keep the incision covered and clean and dry for one week, then ok to get it wet in the shower. ° °Do exercise as instructed several times per day. ° °DO NOT reach behind your back or push up out of a chair with the operative arm. ° °Use a sling while you are up and around for comfort, may remove while seated.  Keep pillow propped behind the operative elbow. ° °Follow up with Dr Lanesha Azzaro in two weeks in the office, call [phone] for appt °

## 2019-05-15 NOTE — Anesthesia Procedure Notes (Signed)
Anesthesia Regional Block: Interscalene brachial plexus block   Pre-Anesthetic Checklist: ,, timeout performed, Correct Patient, Correct Site, Correct Laterality, Correct Procedure, Correct Position, site marked, Risks and benefits discussed,  Surgical consent,  Pre-op evaluation,  At surgeon's request and post-op pain management  Laterality: Left  Prep: chloraprep       Needles:  Injection technique: Single-shot  Needle Type: Echogenic Stimulator Needle     Needle Length: 5cm  Needle Gauge: 22     Additional Needles:   Procedures:,,,, ultrasound used (permanent image in chart),,,,  Narrative:  Start time: 05/15/2019 11:38 AM End time: 05/15/2019 11:48 AM Injection made incrementally with aspirations every 5 mL.  Performed by: Personally  Anesthesiologist: Cecile Hearing, MD  Additional Notes: Functioning IV was confirmed and monitors were applied.  A 57mm 22ga Arrow echogenic stimulator needle was used. Sterile prep and drape, hand hygiene, and sterile gloves were used.  Negative aspiration and negative test dose prior to incremental administration of local anesthetic. The patient tolerated the procedure well.  Ultrasound guidance: relevent anatomy identified, needle position confirmed, local anesthetic spread visualized around nerve(s), vascular puncture avoided.  Image printed for medical record.

## 2019-05-15 NOTE — Progress Notes (Signed)
AssistedDr. Turk with left, ultrasound guided, interscalene  brachial plexus block. Side rails up, monitors on throughout procedure. See vital signs in flow sheet. Tolerated Procedure well.  

## 2019-05-15 NOTE — Plan of Care (Signed)
Plan of care 

## 2019-05-15 NOTE — Brief Op Note (Signed)
05/15/2019  3:47 PM  PATIENT:  Johnny Gonzalez  64 y.o. male  PRE-OPERATIVE DIAGNOSIS:  Left shoulder osteoarthritis, largespinoglenoid notch cyst  POST-OPERATIVE DIAGNOSIS:  Left shoulder osteoarthritis, large spinoglenoid notch cyst  PROCEDURE:  Procedure(s): TOTAL SHOULDER ARTHROPLASTY (Left)  [employer]  Open Spinoglenoid cyst decompression  SURGEON:  Surgeon(s) and Role:    Beverely Low, MD - Primary  PHYSICIAN ASSISTANT:   ASSISTANTS: Thea Gist, PA-C   ANESTHESIA:   regional and general  EBL:  100 mL   BLOOD ADMINISTERED:none  DRAINS: none   LOCAL MEDICATIONS USED:  MARCAINE     SPECIMEN:  No Specimen  DISPOSITION OF SPECIMEN:  N/A  COUNTS:  YES   TOURNIQUET:  * No tourniquets in log *  DICTATION: .Other Dictation: Dictation Number [id number]  PLAN OF CARE: Admit to inpatient   PATIENT DISPOSITION:  PACU - hemodynamically stable.   Delay start of Pharmacological VTE agent (>24hrs) due to surgical blood loss or risk of bleeding: not applicable

## 2019-05-15 NOTE — Anesthesia Procedure Notes (Addendum)
Procedure Name: Intubation Date/Time: 05/15/2019 1:20 PM Performed by: Niel Hummer, CRNA Pre-anesthesia Checklist: Patient identified, Emergency Drugs available, Suction available and Patient being monitored Patient Re-evaluated:Patient Re-evaluated prior to induction Oxygen Delivery Method: Circle system utilized Preoxygenation: Pre-oxygenation with 100% oxygen Induction Type: IV induction and Rapid sequence Laryngoscope Size: Mac and 4 Grade View: Grade II Tube type: Oral Tube size: 7.5 mm Number of attempts: 1 Airway Equipment and Method: Stylet Placement Confirmation: positive ETCO2,  ETT inserted through vocal cords under direct vision and breath sounds checked- equal and bilateral Secured at: 23 cm Tube secured with: Tape Dental Injury: Teeth and Oropharynx as per pre-operative assessment

## 2019-05-15 NOTE — Anesthesia Preprocedure Evaluation (Addendum)
Anesthesia Evaluation  Patient identified by MRN, date of birth, ID band Patient awake    Reviewed: Allergy & Precautions, NPO status , Patient's Chart, lab work & pertinent test results  Airway Mallampati: II  TM Distance: >3 FB Neck ROM: Full    Dental  (+) Teeth Intact, Dental Advisory Given,    Pulmonary neg pulmonary ROS, Patient abstained from smoking.,    Pulmonary exam normal breath sounds clear to auscultation       Cardiovascular hypertension, Pt. on medications Normal cardiovascular exam Rhythm:Regular Rate:Normal     Neuro/Psych negative neurological ROS  negative psych ROS   GI/Hepatic Neg liver ROS, hiatal hernia,   Endo/Other  Obesity   Renal/GU negative Renal ROS     Musculoskeletal  (+) Arthritis , Left shoulder osteoarthritis spinoglenoid notch cyst   Abdominal   Peds  Hematology  (+) Blood dyscrasia (Xarelto), ,   Anesthesia Other Findings Day of surgery medications reviewed with the patient.  Reproductive/Obstetrics                            Anesthesia Physical Anesthesia Plan  ASA: II  Anesthesia Plan: General   Post-op Pain Management:    Induction: Intravenous  PONV Risk Score and Plan: 2 and Midazolam, Dexamethasone and Ondansetron  Airway Management Planned: Oral ETT  Additional Equipment:   Intra-op Plan:   Post-operative Plan: Extubation in OR  Informed Consent: I have reviewed the patients History and Physical, chart, labs and discussed the procedure including the risks, benefits and alternatives for the proposed anesthesia with the patient or authorized representative who has indicated his/her understanding and acceptance.     Dental advisory given  Plan Discussed with: CRNA  Anesthesia Plan Comments:         Anesthesia Quick Evaluation

## 2019-05-15 NOTE — Transfer of Care (Signed)
Immediate Anesthesia Transfer of Care Note  Patient: Johnny Gonzalez  Procedure(s) Performed: TOTAL SHOULDER ARTHROPLASTY (Left Shoulder)  Patient Location: PACU  Anesthesia Type:General  Level of Consciousness: awake, alert  and oriented  Airway & Oxygen Therapy: Patient Spontanous Breathing and Patient connected to face mask oxygen  Post-op Assessment: Report given to RN, Post -op Vital signs reviewed and stable and Patient moving all extremities X 4  Post vital signs: Reviewed and stable  Last Vitals:  Vitals Value Taken Time  BP    Temp    Pulse    Resp 24 05/15/19 1548  SpO2    Vitals shown include unvalidated device data.  Last Pain:  Vitals:   05/15/19 1200  TempSrc:   PainSc: 0-No pain      Patients Stated Pain Goal: 4 (05/15/19 1100)  Complications: No apparent anesthesia complications

## 2019-05-15 NOTE — Anesthesia Postprocedure Evaluation (Signed)
Anesthesia Post Note  Patient: NAS WAFER  Procedure(s) Performed: TOTAL SHOULDER ARTHROPLASTY (Left Shoulder)     Patient location during evaluation: PACU Anesthesia Type: General and Regional Level of consciousness: awake and alert Pain management: pain level controlled Vital Signs Assessment: post-procedure vital signs reviewed and stable Respiratory status: spontaneous breathing, nonlabored ventilation, respiratory function stable and patient connected to nasal cannula oxygen Cardiovascular status: blood pressure returned to baseline and stable Postop Assessment: no apparent nausea or vomiting Anesthetic complications: no    Last Vitals:  Vitals:   05/15/19 1700 05/15/19 1715  BP: 113/73 134/81  Pulse: 73 77  Resp: 19 17  Temp:    SpO2: 92% 94%    Last Pain:  Vitals:   05/15/19 1715  TempSrc:   PainSc: 0-No pain                 Shaylee Stanislawski P Catelyn Friel

## 2019-05-15 NOTE — Anesthesia Procedure Notes (Deleted)
Performed by: Nelle Don, CRNA

## 2019-05-16 DIAGNOSIS — M19012 Primary osteoarthritis, left shoulder: Secondary | ICD-10-CM | POA: Diagnosis not present

## 2019-05-16 LAB — BASIC METABOLIC PANEL
Anion gap: 10 (ref 5–15)
BUN: 22 mg/dL (ref 8–23)
CO2: 26 mmol/L (ref 22–32)
Calcium: 8.5 mg/dL — ABNORMAL LOW (ref 8.9–10.3)
Chloride: 98 mmol/L (ref 98–111)
Creatinine, Ser: 0.95 mg/dL (ref 0.61–1.24)
GFR calc Af Amer: >60 mL/min (ref >60)
GFR calc non Af Amer: >60 mL/min (ref >60)
Glucose, Bld: 126 mg/dL — ABNORMAL HIGH (ref 70–99)
Potassium: 4.2 mmol/L (ref 3.5–5.1)
Sodium: 134 mmol/L — ABNORMAL LOW (ref 135–145)

## 2019-05-16 LAB — HEMOGLOBIN AND HEMATOCRIT, BLOOD
HCT: 36.3 % — ABNORMAL LOW (ref 39.0–52.0)
Hemoglobin: 11.9 g/dL — ABNORMAL LOW (ref 13.0–17.0)

## 2019-05-16 NOTE — Plan of Care (Signed)
Assessment unchanged. Pt verbalized understanding of dc instructions through teach back including when to call the doctor, meds, and care of shoulder. Discharged via wc to front entrance accompanied by NT.

## 2019-05-16 NOTE — Evaluation (Signed)
Occupational Therapy Evaluation Patient Details Name: JOHNATHIN VANDERSCHAAF MRN: [id number] DOB: [birth year] Today's Date: 05/16/2019    History of Present Illness TOTAL SHOULDER ARTHROPLASTY (Left Shoulder)   Clinical Impression   OT eval and education complete    Follow Up Recommendations  Follow surgeon's recommendation for DC plan and follow-up therapies    Equipment Recommendations  None recommended by OT    Recommendations for Other Services       Precautions / Restrictions Precautions Precautions: Fall;Shoulder Type of Shoulder Precautions: elbow, wrist and hand ROM allowed Shoulder Interventions: Shoulder sling/immobilizer Restrictions Weight Bearing Restrictions: Yes LUE Weight Bearing: Non weight bearing      Mobility Bed Mobility Overal bed mobility: Modified Independent                Transfers Overall transfer level: Modified independent                        ADL either performed or assessed with clinical judgement                      Pertinent Vitals/Pain Pain Assessment: No/denies pain     Hand Dominance     Extremity/Trunk Assessment Upper Extremity Assessment Upper Extremity Assessment: LUE deficits/detail LUE Deficits / Details: s.p shoulder TSA           Communication     Cognition Arousal/Alertness: Awake/alert Behavior During Therapy: WFL for tasks assessed/performed Overall Cognitive Status: Within Functional Limits for tasks assessed                                           Shoulder Instructions Shoulder Instructions Donning/doffing shirt without moving shoulder: Minimal assistance;Patient able to independently direct caregiver Method for sponge bathing under operated UE: Minimal assistance;Patient able to independently direct caregiver Donning/doffing sling/immobilizer: Minimal assistance;Patient able to independently direct caregiver Correct positioning of sling/immobilizer: Minimal  assistance;Patient able to independently direct caregiver ROM for elbow, wrist and digits of operated UE: Minimal assistance;Patient able to independently direct caregiver Sling wearing schedule (on at all times/off for ADL's): Minimal assistance;Patient able to independently direct caregiver Proper positioning of operated UE when showering: Minimal assistance;Patient able to independently direct caregiver    Home Living Family/patient expects to be discharged to:: Private residence Living Arrangements: Spouse/significant other Available Help at Discharge: Family Type of Home: House                                                   OT Goals(Current goals can be found in the care plan section) Acute Rehab OT Goals Patient Stated Goal: home today OT Goal Formulation: With patient Time For Goal Achievement: 05/16/19 Potential to Achieve Goals: Good  OT Frequency:      AM-PAC OT "6 Clicks" Daily Activity     Outcome Measure Help from another person eating meals?: None Help from another person taking care of personal grooming?: None Help from another person toileting, which includes using toliet, bedpan, or urinal?: None Help from another person bathing (including washing, rinsing, drying)?: A Little Help from another person to put on and taking off regular upper body clothing?: A Little Help from another person to put on and taking off regular  lower body clothing?: A Little 6 Click Score: 21   End of Session    Activity Tolerance: Patient tolerated treatment well Patient left: in chair                   Time: 1010-1033 OT Time Calculation (min): 23 min Charges:  OT General Charges $OT Visit: 1 Visit OT Evaluation $OT Eval Moderate Complexity: 1 Mod  Kari Baars, OT Acute Rehabilitation Services [phone] Office- [phone]     Amear Strojny, Edwena Felty D 05/16/2019, 12:12 PM

## 2019-05-16 NOTE — Progress Notes (Signed)
Subjective: 1 Day Post-Op Procedure(s) (LRB): TOTAL SHOULDER ARTHROPLASTY (Left) Patient reports pain as mild.   No events over night +ambulation +void +flatus +ambulation with therapy.  Tolerating PO w/o N/V Denies other complaints. States he is ready to go home.   Objective: Vital signs in last 24 hours: Temp:  [97.4 F (36.3 C)-98.7 F (37.1 C)] 97.6 F (36.4 C) (02/20 0906) Pulse Rate:  [67-86] 75 (02/20 0906) Resp:  [15-25] 18 (02/20 0558) BP: (105-155)/(66-91) 113/76 (02/20 0906) SpO2:  [92 %-100 %] 99 % (02/20 0906) Weight:  [270 kg] 108 kg (02/19 1824)  Intake/Output from previous day: 02/19 0701 - 02/20 0700 In: 2119 [P.O.:420; I.V.:1210; IV Piggyback:150] Out: 625 [Urine:525; Blood:100] Intake/Output this shift: Total I/O In: 225 [I.V.:125; IV Piggyback:100] Out: -   Recent Labs    05/16/19 0445  HGB 11.9*   Recent Labs    05/16/19 0445  HCT 36.3*   Recent Labs    05/16/19 0445  NA 134*  K 4.2  CL 98  CO2 26  BUN 22  CREATININE 0.95  GLUCOSE 126*  CALCIUM 8.5*   No results for input(s): LABPT, INR in the last 72 hours.  Neurologically intact ABD soft Neurovascular intact Sensation intact distally Intact pulses distally Incision: dressing C/D/I No calf tenderness, Homan's negative   Assessment/Plan: 1 Day Post-Op Procedure(s) (LRB): TOTAL SHOULDER ARTHROPLASTY (Left) Advance diet Encourage IS DVT PPx: Teds, SCDs, ambulation  D/C home today   Johnny Gonzalez 05/16/2019, 9:25 AM

## 2019-05-16 NOTE — Op Note (Signed)
NAME: Johnny Gonzalez, Johnny Gonzalez MEDICAL RECORD [id number] ACCOUNT [id number] DATE OF BIRTH:Aug 16, 1955 FACILITY: [facility] LOCATION: [place], [place]  OPERATIVE REPORT  DATE OF PROCEDURE:  05/15/2019  PREOPERATIVE DIAGNOSES:   1.  Left shoulder end-stage osteoarthritis. 2.  Large spinoglenoid notch cyst.  POSTOPERATIVE DIAGNOSES:   1.  Left shoulder end-stage osteoarthritis. 2.  Large spinoglenoid notch cyst.  PROCEDURES PERFORMED:   1.  Left anatomic total shoulder arthroplasty. 2.  Left open spinoglenoid notch cyst decompression.  ATTENDING SURGEON:  Esmond Plants, MD  ASSISTANT:  Darol Destine, [place], who was scrubbed during the entire procedure and necessary for satisfactory completion of surgery.  ANESTHESIA:  General anesthesia was used plus interscalene block.  ESTIMATED BLOOD LOSS:  100 mL.  FLUID REPLACEMENT:  1500 mL crystalloid.  INSTRUMENT COUNTS:  Correct.  COMPLICATIONS:  No complications.  ANTIBIOTICS:  Perioperative antibiotics were given.  INDICATIONS:  The patient is a 64 year old male with worsening left shoulder pain and dysfunction secondary to end-stage shoulder arthritis.  The patient has significant retroversion of the glenoid and bone-on-bone and has a large spinoglenoid notch cyst  on MRI which is compressing the supraspinatus muscle.  Having talked over options with the patient and given the failure of conservative management, he elected to proceed with shoulder arthroplasty anatomic with open cyst decompression.  Risks and  benefits of surgery discussed.  Informed consent obtained.  DESCRIPTION OF PROCEDURE:  After an adequate level of anesthesia was achieved, the patient was positioned in the modified beach chair position.  Left shoulder correctly identified and sterilely prepped and draped in the usual manner.  Time-out called,  verifying correct patient, correct side.  We entered the patient's shoulder using standard deltopectoral  incision, starting at the coracoid process, extending down to the anterior humerus, dissection down through subcutaneous tissues using a needle tip  Bovie.  We identified the cephalic vein and took that laterally with the deltoid pectoralis taken medially, conjoined tendon identified and retracted medially.  Biceps was tenodesed in situ with figure-of-eight suture x2 using 0 Vicryl to incorporate the  pectoralis tendon.  We then released the subscapularis subperiosteally off the lesser tuberosity in a peel-type fashion.  We tagged with  #2 FiberWire suture in a modified Mason-Allen suture technique to repair the tendon at the conclusion of surgery.   We then released the inferior capsule, progressively externally rotating.  We placed the shoulder in 20 degrees of external rotation and made our cut 20 degrees retroverted using the cutting guide and the oscillating saw.  We removed excess osteophytes  with a rongeur.  We then subluxed the humerus posteriorly.  We were pleased with our head cut, which was right at the junction of the rotator cuff and the greater tuberosity.  At this point, the glenoid was exposed.  We did a 360-degree capsule labral  excision with Bovie.  We then used initially a Soil scientist, followed by a tonsil clamp up and over the top of the superior glenoid and into the area of the supraspinatus fossa.  We were able to easily identify the decompression of the cyst.  Mucinous  material was expressed voluminously and I was able to also put back a pair of Kellys and open up inside and also placed suction back there.  We were able to get that evacuated nicely using this open technique.  We irrigated thoroughly.  We then noted  there to be significant bone-on-bone and retroversion.  We placed our guide pin to counter that  retroversion and ream the high side down anteriorly.  Once we were happy with our guide pin placement, then we used our reamer to ream down the high side and  get a nice  flush reaming for the size 48 glenoid component.  Once we had completed our reaming, we drilled our central peg hole.  We then used the guide to drill the 3 peripheral holes.  We then irrigated thoroughly, did our trial, which was a 48 APG  trial, impacted that in position and had good bony support all the way around.  We retrieved the trial, irrigated again and then used epi-soaked Gelfoam in the 3 peripheral holes, vacuum mixed cement on the back table and cemented the APG glenoid with  those 3 peripheral holes without complication.  We held pressure on the glenoid until the cement was hard.  We then directed our attention towards the humeral side.  We reamed up to a size 12 and then broached with a 10 and then a 12 stem.  We impacted  the 12 stem into place, started with the 48 and then worked quickly up to the 52/18 and eventually the 52/21 to get best coverage over the bone and to get best balance in the shoulder.  With those trials in place, the 12 stem and the 52/21 eccentric  dialed posterior superiorly,  we had nice stability.  I did deflect the humerus posteriorly and it would relocate spontaneously back into the joint.  We removed the trial components, irrigated again.  We prepped the lesser tuberosity for healing, getting  down to decorticated bone.  We then drilled holes for our sutures, a total of 3 holes and 2 mattress sutures in the lesser tuberosity for repair of the subscap.  We then used available bone graft from the head in impaction grafting technique and  impacted the stem into place.  We had excellent stem security at 20 degrees of retroversion and then used a 52/21 eccentric placed superior posteriorly, impacted that in position with great bony coverage.  We reduced the shoulder.  We were happy with our  balancing and we then went ahead and irrigated again and repaired our subscap anatomically back to bone, including rotator interval closure.  We had a nice stable shoulder.  Ranged the  shoulder, no impingement noted.  At this point, we concluded the  surgery by closing our deltopectoral interval with 0 Vicryl suture, followed by 2-0 Vicryl for subcutaneous closure and 4-0 Monocryl for skin.  Steri-Strips applied, followed by sterile dressing.  The patient tolerated surgery well.  VN/NUANCE  D:05/15/2019 T:05/16/2019 JOB:[id number]

## 2019-05-17 NOTE — Discharge Summary (Signed)
Orthopedic Discharge Summary        Physician Discharge Summary  Patient ID: Johnny Gonzalez MRN: [id number] DOB/AGE: 65-Oct-1957 65 y.o.  Admit date: 05/15/2019 Discharge date: 05/17/2019   Procedures:  Procedure(s) (LRB): TOTAL SHOULDER ARTHROPLASTY (Left)  Attending Physician:  Dr. Malon Kindle  Admission Diagnoses:   Left shoulder OA and spinoglenoid notch cyst  Discharge Diagnoses:  same   Past Medical History:  Diagnosis Date  . Arthritis   . Back pain   . H/O hiatal hernia   . History of shingles   . Hyperlipidemia    borderline but has lost weight and eating better;takes Fish oil  . Hypertension    takes Diovan daily  . Joint pain   . Weakness    numbness and tingling related to shoulder problems    PCP: Albertina Senegal, MD   Discharged Condition: good  Hospital Course:  Patient underwent the above stated procedure on 05/15/2019. Patient tolerated the procedure well and brought to the recovery room in good condition and subsequently to the floor. Patient had an uncomplicated hospital course and was stable for discharge.   Disposition: Discharge disposition: 01-Home or Self Care      with follow up in 2 weeks   Follow-up Information    Beverely Low, MD. Call in 2 weeks.   Specialty: Orthopedic Surgery Why: [phone] Contact information: [address] [place] [address] [phone]             Allergies as of 05/16/2019      Reactions   Penicillins Other (See Comments)   Childhood allergy. Did it involve swelling of the face/tongue/throat, SOB, or low BP? Unknown Did it involve sudden or severe rash/hives, skin peeling, or any reaction on the inside of your mouth or nose? Unknown Did you need to seek medical attention at a hospital or doctor's office? Unknown When did it last happen? If all above answers are "NO", may proceed with cephalosporin use.      Medication List    TAKE these medications    acetaminophen 500 MG tablet Commonly known as: TYLENOL Take 1,000 mg by mouth every 6 (six) hours as needed for moderate pain.   calcium carbonate 500 MG chewable tablet Commonly known as: TUMS - dosed in mg elemental calcium Chew 2 tablets by mouth 3 (three) times daily as needed for indigestion or heartburn.   methocarbamol 500 MG tablet Commonly known as: ROBAXIN Take 1 tablet (500 mg total) by mouth every 6 (six) hours as needed for muscle spasms.   oxyCODONE 5 MG immediate release tablet Commonly known as: Oxy IR/ROXICODONE Take 1-2 tablets (5-10 mg total) by mouth every 4 (four) hours as needed for severe pain or breakthrough pain. What changed:   when to take this  reasons to take this   rivaroxaban 10 MG Tabs tablet Commonly known as: XARELTO Take 1 tablet (10 mg total) by mouth daily with breakfast.   telmisartan-hydrochlorothiazide 80-25 MG tablet Commonly known as: MICARDIS HCT Take 1 tablet by mouth daily.   traMADol 50 MG tablet Commonly known as: ULTRAM Take 1-2 tablets (50-100 mg total) by mouth every 6 (six) hours as needed for moderate pain.         Signed: Verlee Rossetti 05/17/2019, 8:26 AM  [facility] is now [facility] [address], [address], [place], [place] [address] Phone: [phone] Facebook  Instagram  [employer]

## 2019-05-18 ENCOUNTER — Encounter: Payer: Self-pay | Admitting: *Deleted

## 2019-08-31 ENCOUNTER — Other Ambulatory Visit: Payer: Self-pay | Admitting: Family Medicine

## 2019-08-31 DIAGNOSIS — Z136 Encounter for screening for cardiovascular disorders: Secondary | ICD-10-CM

## 2019-12-20 ENCOUNTER — Telehealth: Payer: Self-pay | Admitting: Physician Assistant

## 2019-12-20 ENCOUNTER — Other Ambulatory Visit: Payer: Self-pay | Admitting: Physician Assistant

## 2019-12-20 DIAGNOSIS — E663 Overweight: Secondary | ICD-10-CM

## 2019-12-20 DIAGNOSIS — I10 Essential (primary) hypertension: Secondary | ICD-10-CM

## 2019-12-20 DIAGNOSIS — U071 COVID-19: Secondary | ICD-10-CM

## 2019-12-20 NOTE — Progress Notes (Signed)
I connected by phone with Johnny Gonzalez on 12/20/2019 at 2:40 PM to discuss the potential use of a new treatment for mild to moderate COVID-19 viral infection in non-hospitalized patients.  This patient is a 64 y.o. male that meets the FDA criteria for Emergency Use Authorization of COVID monoclonal antibody casirivimab/imdevimab.  Has a (+) direct SARS-CoV-2 viral test result  Has mild or moderate COVID-19   Is NOT hospitalized due to COVID-19  Is within 10 days of symptom onset  Has at least one of the high risk factor(s) for progression to severe COVID-19 and/or hospitalization as defined in EUA.  Specific high risk criteria : Older age (>/= 65 yo), BMI > 25 and Cardiovascular disease or hypertension   I have spoken and communicated the following to the patient or parent/caregiver regarding COVID monoclonal antibody treatment:  1. FDA has authorized the emergency use for the treatment of mild to moderate COVID-19 in adults and pediatric patients with positive results of direct SARS-CoV-2 viral testing who are 43 years of age and older weighing at least 40 kg, and who are at high risk for progressing to severe COVID-19 and/or hospitalization.  2. The significant known and potential risks and benefits of COVID monoclonal antibody, and the extent to which such potential risks and benefits are unknown.  3. Information on available alternative treatments and the risks and benefits of those alternatives, including clinical trials.  4. Patients treated with COVID monoclonal antibody should continue to self-isolate and use infection control measures (e.g., wear mask, isolate, social distance, avoid sharing personal items, clean and disinfect "high touch" surfaces, and frequent handwashing) according to CDC guidelines.   5. The patient or parent/caregiver has the option to accept or refuse COVID monoclonal antibody treatment.  After reviewing this information with the patient, the patient has  agreed to receive one of the available covid 19 monoclonal antibodies and will be provided an appropriate fact sheet prior to infusion.  Sx onset 9/19. Set up for infusion on 9/27 @ 9:30am. Directions given to [facility]. Pt is aware that insurance will be charged an infusion fee. Pt is unvaccinated.   Cline Crock 12/20/2019 2:40 PM

## 2019-12-20 NOTE — Telephone Encounter (Signed)
Called to discuss with patient about Covid symptoms and the use of casirivimab/imdevimab, a monoclonal antibody infusion for those with mild to moderate Covid symptoms and at a high risk of hospitalization.  Pt is qualified for this infusion at the [facility] infusion center due to; Specific high risk criteria : Older age (>/= 65 yo) and BMI > 25   Message left to call back our hotline [phone].  Cline Crock PA-C  [facility]

## 2019-12-21 ENCOUNTER — Other Ambulatory Visit (HOSPITAL_COMMUNITY): Payer: Self-pay

## 2019-12-21 ENCOUNTER — Ambulatory Visit (HOSPITAL_COMMUNITY)
Admission: RE | Admit: 2019-12-21 | Discharge: 2019-12-21 | Disposition: A | Discharge status: Home / Self Care | Payer: Medicare Other | Source: Ambulatory Visit | Attending: Pulmonary Disease | Admitting: Pulmonary Disease

## 2019-12-21 DIAGNOSIS — I10 Essential (primary) hypertension: Secondary | ICD-10-CM

## 2019-12-21 DIAGNOSIS — U071 COVID-19: Secondary | ICD-10-CM | POA: Diagnosis present

## 2019-12-21 DIAGNOSIS — Z23 Encounter for immunization: Secondary | ICD-10-CM | POA: Diagnosis not present

## 2019-12-21 DIAGNOSIS — E663 Overweight: Secondary | ICD-10-CM | POA: Insufficient documentation

## 2019-12-21 MED ORDER — EPINEPHRINE 0.3 MG/0.3ML IJ SOAJ: 0.3000 mg | Freq: Once | INTRAMUSCULAR | Status: DC | PRN

## 2019-12-21 MED ORDER — FAMOTIDINE IN NACL 20-0.9 MG/50ML-% IV SOLN: 20.0000 mg | Freq: Once | INTRAVENOUS | Status: DC | PRN

## 2019-12-21 MED ORDER — SODIUM CHLORIDE 0.9 % IV SOLN: INTRAVENOUS | Status: DC | PRN

## 2019-12-21 MED ORDER — ALBUTEROL SULFATE HFA 108 (90 BASE) MCG/ACT IN AERS: 2.0000 | INHALATION_SPRAY | Freq: Once | RESPIRATORY_TRACT | Status: DC | PRN

## 2019-12-21 MED ORDER — DIPHENHYDRAMINE HCL 50 MG/ML IJ SOLN: 50.0000 mg | Freq: Once | INTRAMUSCULAR | Status: DC | PRN

## 2019-12-21 MED ORDER — METHYLPREDNISOLONE SODIUM SUCC 125 MG IJ SOLR: 125.0000 mg | Freq: Once | INTRAMUSCULAR | Status: DC | PRN

## 2019-12-21 MED ORDER — SODIUM CHLORIDE 0.9 % IV SOLN
1200.0000 mg | Freq: Once | INTRAVENOUS | Status: AC
  Administered 2019-12-21: 1200 mg via INTRAVENOUS

## 2019-12-21 NOTE — Discharge Instructions (Signed)

## 2019-12-21 NOTE — Progress Notes (Signed)
  Diagnosis: COVID-19  Physician: Patrick Wright  Procedure: [facility]id Infusion Clinic Med: casirivimab\imdevimab infusion - Provided patient with casirivimab\imdevimab fact sheet for patients, parents and caregivers prior to infusion.  Complications: No immediate complications noted.  Discharge: Discharged home   Ranya Fiddler 12/21/2019  

## 2023-06-17 ENCOUNTER — Observation Stay (HOSPITAL_COMMUNITY)
Admission: EM | Admit: 2023-06-17 | Discharge: 2023-06-18 | Disposition: A | Discharge status: Home / Self Care | Attending: Orthopedic Surgery | Admitting: Orthopedic Surgery

## 2023-06-17 ENCOUNTER — Encounter (HOSPITAL_COMMUNITY)
Admission: EM | Disposition: A | Discharge status: Home / Self Care | Payer: Self-pay | Source: Home / Self Care | Attending: Emergency Medicine

## 2023-06-17 ENCOUNTER — Emergency Department (HOSPITAL_COMMUNITY)

## 2023-06-17 ENCOUNTER — Emergency Department (HOSPITAL_BASED_OUTPATIENT_CLINIC_OR_DEPARTMENT_OTHER): Admitting: Anesthesiology

## 2023-06-17 ENCOUNTER — Encounter (HOSPITAL_COMMUNITY): Payer: Self-pay | Admitting: Emergency Medicine

## 2023-06-17 ENCOUNTER — Other Ambulatory Visit: Payer: Self-pay

## 2023-06-17 ENCOUNTER — Observation Stay (HOSPITAL_COMMUNITY)

## 2023-06-17 ENCOUNTER — Emergency Department (HOSPITAL_COMMUNITY): Admitting: Anesthesiology

## 2023-06-17 DIAGNOSIS — S82891A Other fracture of right lower leg, initial encounter for closed fracture: Principal | ICD-10-CM

## 2023-06-17 DIAGNOSIS — S82841A Displaced bimalleolar fracture of right lower leg, initial encounter for closed fracture: Secondary | ICD-10-CM

## 2023-06-17 DIAGNOSIS — S93431A Sprain of tibiofibular ligament of right ankle, initial encounter: Secondary | ICD-10-CM

## 2023-06-17 DIAGNOSIS — I10 Essential (primary) hypertension: Secondary | ICD-10-CM | POA: Diagnosis not present

## 2023-06-17 DIAGNOSIS — W1811XA Fall from or off toilet without subsequent striking against object, initial encounter: Secondary | ICD-10-CM | POA: Diagnosis not present

## 2023-06-17 DIAGNOSIS — S93439A Sprain of tibiofibular ligament of unspecified ankle, initial encounter: Secondary | ICD-10-CM

## 2023-06-17 DIAGNOSIS — M25571 Pain in right ankle and joints of right foot: Secondary | ICD-10-CM | POA: Diagnosis present

## 2023-06-17 LAB — COMPREHENSIVE METABOLIC PANEL
ALT: 18 U/L (ref 0–44)
AST: 21 U/L (ref 15–41)
Albumin: 3.9 g/dL (ref 3.5–5.0)
Alkaline Phosphatase: 57 U/L (ref 38–126)
Anion gap: 11 (ref 5–15)
BUN: 24 mg/dL — ABNORMAL HIGH (ref 8–23)
CO2: 23 mmol/L (ref 22–32)
Calcium: 9 mg/dL (ref 8.9–10.3)
Chloride: 99 mmol/L (ref 98–111)
Creatinine, Ser: 1.2 mg/dL (ref 0.61–1.24)
GFR, Estimated: >60 mL/min (ref >60)
Glucose, Bld: 114 mg/dL — ABNORMAL HIGH (ref 70–99)
Potassium: 4.1 mmol/L (ref 3.5–5.1)
Sodium: 133 mmol/L — ABNORMAL LOW (ref 135–145)
Total Bilirubin: 0.8 mg/dL (ref 0.0–1.2)
Total Protein: 7.1 g/dL (ref 6.5–8.1)

## 2023-06-17 LAB — CBC WITH DIFFERENTIAL/PLATELET
Abs Immature Granulocytes: 0.08 10*3/uL — ABNORMAL HIGH (ref 0.00–0.07)
Basophils Absolute: 0 10*3/uL (ref 0.0–0.1)
Basophils Relative: 0 %
Eosinophils Absolute: 0 10*3/uL (ref 0.0–0.5)
Eosinophils Relative: 0 %
HCT: 38.9 % — ABNORMAL LOW (ref 39.0–52.0)
Hemoglobin: 12.8 g/dL — ABNORMAL LOW (ref 13.0–17.0)
Immature Granulocytes: 1 %
Lymphocytes Relative: 11 %
Lymphs Abs: 1.5 10*3/uL (ref 0.7–4.0)
MCH: 29.3 pg (ref 26.0–34.0)
MCHC: 32.9 g/dL (ref 30.0–36.0)
MCV: 89 fL (ref 80.0–100.0)
Monocytes Absolute: 1.2 10*3/uL — ABNORMAL HIGH (ref 0.1–1.0)
Monocytes Relative: 9 %
Neutro Abs: 10.8 10*3/uL — ABNORMAL HIGH (ref 1.7–7.7)
Neutrophils Relative %: 79 %
Platelets: 204 10*3/uL (ref 150–400)
RBC: 4.37 MIL/uL (ref 4.22–5.81)
RDW: 13.7 % (ref 11.5–15.5)
WBC: 13.6 10*3/uL — ABNORMAL HIGH (ref 4.0–10.5)
nRBC: 0 % (ref 0.0–0.2)

## 2023-06-17 LAB — TYPE AND SCREEN
ABO/RH(D): O POS
Antibody Screen: NEGATIVE

## 2023-06-17 SURGERY — OPEN REDUCTION INTERNAL FIXATION (ORIF) ANKLE FRACTURE
Anesthesia: General | Site: Ankle | Laterality: Right

## 2023-06-17 MED ORDER — SENNA 8.6 MG PO TABS
1.0000 | ORAL_TABLET | Freq: Two times a day (BID) | ORAL | 0 refills | Status: AC
  Filled 2023-06-17: qty 28, 14d supply, fill #0

## 2023-06-17 MED ORDER — FENTANYL CITRATE (PF) 100 MCG/2ML IJ SOLN
INTRAMUSCULAR | Status: AC
  Administered 2023-06-17: 100 ug via INTRAVENOUS
  Filled 2023-06-17: qty 2

## 2023-06-17 MED ORDER — ACETAMINOPHEN 500 MG PO TABS: 1000.0000 mg | ORAL_TABLET | Freq: Once | ORAL | Status: DC

## 2023-06-17 MED ORDER — TRANEXAMIC ACID-NACL 1000-0.7 MG/100ML-% IV SOLN
1000.0000 mg | Freq: Once | INTRAVENOUS | Status: AC
Start: 2023-06-17 — End: 2023-06-18
  Administered 2023-06-17: 1000 mg via INTRAVENOUS
  Filled 2023-06-17: qty 100

## 2023-06-17 MED ORDER — CEFAZOLIN SODIUM-DEXTROSE 2-4 GM/100ML-% IV SOLN
2.0000 g | INTRAVENOUS | Status: AC
  Administered 2023-06-17: 2 g via INTRAVENOUS

## 2023-06-17 MED ORDER — ASPIRIN 81 MG PO CHEW
81.0000 mg | CHEWABLE_TABLET | Freq: Two times a day (BID) | ORAL | Status: DC
  Administered 2023-06-17 – 2023-06-18 (×2): 81 mg via ORAL
  Filled 2023-06-17 (×2): qty 1

## 2023-06-17 MED ORDER — HYDROMORPHONE HCL 1 MG/ML IJ SOLN: 0.2500 mg | INTRAMUSCULAR | Status: DC | PRN

## 2023-06-17 MED ORDER — ACETAMINOPHEN 10 MG/ML IV SOLN
INTRAVENOUS | Status: DC | PRN
  Administered 2023-06-17: 1000 mg via INTRAVENOUS

## 2023-06-17 MED ORDER — ONDANSETRON HCL 4 MG PO TABS: 4.0000 mg | ORAL_TABLET | Freq: Four times a day (QID) | ORAL | Status: DC | PRN

## 2023-06-17 MED ORDER — LIDOCAINE 2% (20 MG/ML) 5 ML SYRINGE
INTRAMUSCULAR | Status: DC | PRN
  Administered 2023-06-17: 20 mg via INTRAVENOUS

## 2023-06-17 MED ORDER — PROPOFOL 10 MG/ML IV BOLUS
INTRAVENOUS | Status: DC | PRN
  Administered 2023-06-17: 150 mg via INTRAVENOUS

## 2023-06-17 MED ORDER — BUPIVACAINE HCL (PF) 0.25 % IJ SOLN
INTRAMUSCULAR | Status: AC
Start: 2023-06-17 — End: ?
  Filled 2023-06-17: qty 30

## 2023-06-17 MED ORDER — CEFAZOLIN SODIUM-DEXTROSE 2-4 GM/100ML-% IV SOLN
2.0000 g | Freq: Four times a day (QID) | INTRAVENOUS | Status: AC
  Administered 2023-06-17 – 2023-06-18 (×2): 2 g via INTRAVENOUS
  Filled 2023-06-17 (×2): qty 100

## 2023-06-17 MED ORDER — SENNA 8.6 MG PO TABS
1.0000 | ORAL_TABLET | Freq: Two times a day (BID) | ORAL | Status: DC
  Filled 2023-06-17: qty 1

## 2023-06-17 MED ORDER — HYDROMORPHONE HCL 1 MG/ML IJ SOLN
1.0000 mg | Freq: Once | INTRAMUSCULAR | Status: AC
  Administered 2023-06-17: 1 mg via INTRAVENOUS
  Filled 2023-06-17: qty 1

## 2023-06-17 MED ORDER — STERILE WATER FOR IRRIGATION IR SOLN
Status: DC | PRN
  Administered 2023-06-17: 1000 mL

## 2023-06-17 MED ORDER — CHLORHEXIDINE GLUCONATE 0.12 % MT SOLN
15.0000 mL | Freq: Once | OROMUCOSAL | Status: AC
  Administered 2023-06-17: 15 mL via OROMUCOSAL

## 2023-06-17 MED ORDER — TRANEXAMIC ACID-NACL 1000-0.7 MG/100ML-% IV SOLN
1000.0000 mg | INTRAVENOUS | Status: AC
  Administered 2023-06-17: 1000 mg via INTRAVENOUS
  Filled 2023-06-17: qty 100

## 2023-06-17 MED ORDER — DEXAMETHASONE SODIUM PHOSPHATE 10 MG/ML IJ SOLN
INTRAMUSCULAR | Status: DC | PRN
  Administered 2023-06-17: 10 mg via INTRAVENOUS

## 2023-06-17 MED ORDER — MIDAZOLAM HCL 2 MG/2ML IJ SOLN
INTRAMUSCULAR | Status: AC
  Administered 2023-06-17: 2 mg via INTRAVENOUS
  Filled 2023-06-17: qty 2

## 2023-06-17 MED ORDER — ACETAMINOPHEN 10 MG/ML IV SOLN
INTRAVENOUS | Status: AC
Start: 2023-06-17 — End: 2023-06-17
  Filled 2023-06-17: qty 100

## 2023-06-17 MED ORDER — CEFAZOLIN SODIUM-DEXTROSE 2-4 GM/100ML-% IV SOLN
2.0000 g | Freq: Once | INTRAVENOUS | Status: AC
  Administered 2023-06-17: 2 g via INTRAVENOUS
  Filled 2023-06-17: qty 100

## 2023-06-17 MED ORDER — ONDANSETRON HCL 4 MG/2ML IJ SOLN
INTRAMUSCULAR | Status: DC | PRN
  Administered 2023-06-17: 4 mg via INTRAVENOUS

## 2023-06-17 MED ORDER — PROPOFOL 10 MG/ML IV BOLUS
0.5000 mg/kg | Freq: Once | INTRAVENOUS | Status: AC
  Administered 2023-06-17: 100 mg via INTRAVENOUS
  Filled 2023-06-17: qty 20

## 2023-06-17 MED ORDER — OXYCODONE HCL 5 MG PO TABS: 5.0000 mg | ORAL_TABLET | ORAL | Status: DC | PRN

## 2023-06-17 MED ORDER — OXYCODONE HCL 5 MG PO TABS
5.0000 mg | ORAL_TABLET | ORAL | 0 refills | Status: DC | PRN
Start: 2023-06-17 — End: 2023-06-24
  Filled 2023-06-17: qty 40, 4d supply, fill #0

## 2023-06-17 MED ORDER — PHENYLEPHRINE 80 MCG/ML (10ML) SYRINGE FOR IV PUSH (FOR BLOOD PRESSURE SUPPORT)
PREFILLED_SYRINGE | INTRAVENOUS | Status: DC | PRN
  Administered 2023-06-17 (×2): 80 ug via INTRAVENOUS

## 2023-06-17 MED ORDER — BUPIVACAINE LIPOSOME 1.3 % IJ SUSP
INTRAMUSCULAR | Status: DC | PRN
Start: 2023-06-17 — End: 2023-06-17
  Administered 2023-06-17: 10 mL via PERINEURAL

## 2023-06-17 MED ORDER — FENTANYL CITRATE (PF) 100 MCG/2ML IJ SOLN: 100.0000 ug | Freq: Once | INTRAMUSCULAR | Status: AC

## 2023-06-17 MED ORDER — PHENYLEPHRINE HCL-NACL 20-0.9 MG/250ML-% IV SOLN
INTRAVENOUS | Status: DC | PRN
  Administered 2023-06-17: 30 ug/min via INTRAVENOUS

## 2023-06-17 MED ORDER — FENTANYL CITRATE PF 50 MCG/ML IJ SOSY
100.0000 ug | PREFILLED_SYRINGE | Freq: Once | INTRAMUSCULAR | Status: AC
  Administered 2023-06-17: 100 ug via INTRAVENOUS
  Filled 2023-06-17: qty 2

## 2023-06-17 MED ORDER — ORAL CARE MOUTH RINSE: 15.0000 mL | Freq: Once | OROMUCOSAL | Status: AC

## 2023-06-17 MED ORDER — SODIUM CHLORIDE 0.9 % IV BOLUS
1000.0000 mL | Freq: Once | INTRAVENOUS | Status: AC
  Administered 2023-06-17: 1000 mL via INTRAVENOUS

## 2023-06-17 MED ORDER — POLYETHYLENE GLYCOL 3350 17 G PO PACK
17.0000 g | PACK | Freq: Every day | ORAL | Status: DC
  Filled 2023-06-17: qty 1

## 2023-06-17 MED ORDER — ONDANSETRON HCL 4 MG/2ML IJ SOLN
4.0000 mg | Freq: Once | INTRAMUSCULAR | Status: AC
  Administered 2023-06-17: 4 mg via INTRAVENOUS
  Filled 2023-06-17: qty 2

## 2023-06-17 MED ORDER — BUPIVACAINE-EPINEPHRINE (PF) 0.5% -1:200000 IJ SOLN
INTRAMUSCULAR | Status: DC | PRN
  Administered 2023-06-17: 15 mL via PERINEURAL
  Administered 2023-06-17: 20 mL via PERINEURAL

## 2023-06-17 MED ORDER — LACTATED RINGERS IV SOLN: INTRAVENOUS | Status: DC

## 2023-06-17 MED ORDER — ACETAMINOPHEN 500 MG PO TABS
1000.0000 mg | ORAL_TABLET | Freq: Three times a day (TID) | ORAL | Status: DC
  Administered 2023-06-18: 1000 mg via ORAL
  Filled 2023-06-17 (×2): qty 2

## 2023-06-17 MED ORDER — MIDAZOLAM HCL 2 MG/2ML IJ SOLN: 2.0000 mg | Freq: Once | INTRAMUSCULAR | Status: AC

## 2023-06-17 MED ORDER — ACETAMINOPHEN 500 MG PO TABS
1000.0000 mg | ORAL_TABLET | Freq: Three times a day (TID) | ORAL | 0 refills | Status: AC
  Filled 2023-06-17: qty 84, 14d supply, fill #0

## 2023-06-17 MED ORDER — POLYETHYLENE GLYCOL 3350 17 GM/SCOOP PO POWD
17.0000 g | Freq: Every day | ORAL | 0 refills | Status: AC
  Filled 2023-06-17: qty 238, 14d supply, fill #0

## 2023-06-17 MED ORDER — CALCIUM CARBONATE ANTACID 500 MG PO CHEW: 2.0000 | CHEWABLE_TABLET | Freq: Three times a day (TID) | ORAL | Status: DC | PRN

## 2023-06-17 MED ORDER — ONDANSETRON HCL 4 MG/2ML IJ SOLN: 4.0000 mg | Freq: Four times a day (QID) | INTRAMUSCULAR | Status: DC | PRN

## 2023-06-17 MED ORDER — PROPOFOL 10 MG/ML IV BOLUS
INTRAVENOUS | Status: AC
Start: 2023-06-17 — End: ?
  Filled 2023-06-17: qty 20

## 2023-06-17 MED ORDER — 0.9 % SODIUM CHLORIDE (POUR BTL) OPTIME
TOPICAL | Status: DC | PRN
  Administered 2023-06-17: 1000 mL

## 2023-06-17 MED ORDER — FENTANYL CITRATE PF 50 MCG/ML IJ SOSY
200.0000 ug | PREFILLED_SYRINGE | Freq: Once | INTRAMUSCULAR | Status: AC
  Administered 2023-06-17: 200 ug via INTRAVENOUS
  Filled 2023-06-17: qty 4

## 2023-06-17 MED ORDER — SODIUM CHLORIDE 0.9 % IV SOLN: INTRAVENOUS | Status: DC

## 2023-06-17 MED ORDER — ASPIRIN 81 MG PO CHEW
81.0000 mg | CHEWABLE_TABLET | Freq: Two times a day (BID) | ORAL | 0 refills | Status: AC
  Filled 2023-06-17: qty 84, 42d supply, fill #0

## 2023-06-17 SURGICAL SUPPLY — 66 items
BAG COUNTER SPONGE SURGICOUNT (BAG) ×1 IMPLANT
BANDAGE ESMARK 6X9 LF (GAUZE/BANDAGES/DRESSINGS) IMPLANT
BIT DRILL 2.7 QC CANN 155 (BIT) IMPLANT
BIT DRILL QC 2.0 SHORT EVOS SM (DRILL) IMPLANT
BIT DRILL QC 2.5MM SHRT EVO SM (DRILL) IMPLANT
BLADE SURG 15 STRL LF DISP TIS (BLADE) ×1 IMPLANT
BNDG COHESIVE 4X5 TAN STRL LF (GAUZE/BANDAGES/DRESSINGS) ×1 IMPLANT
BNDG ELASTIC 4X5.8 VLCR NS LF (GAUZE/BANDAGES/DRESSINGS) IMPLANT
BNDG ELASTIC 4X5.8 VLCR STR LF (GAUZE/BANDAGES/DRESSINGS) IMPLANT
BNDG ELASTIC 6INX 5YD STR LF (GAUZE/BANDAGES/DRESSINGS) IMPLANT
BNDG ESMARK 6X9 LF (GAUZE/BANDAGES/DRESSINGS) IMPLANT
BNDG GAUZE DERMACEA FLUFF 4 (GAUZE/BANDAGES/DRESSINGS) IMPLANT
CANISTER SUCT 3000ML PPV (MISCELLANEOUS) ×1 IMPLANT
COVER SURGICAL LIGHT HANDLE (MISCELLANEOUS) ×1 IMPLANT
CUFF TOURN SGL QUICK 42 (TOURNIQUET CUFF) IMPLANT
CUFF TRNQT CYL 34X4.125X (TOURNIQUET CUFF) IMPLANT
DRAPE C-ARM 42X72 X-RAY (DRAPES) ×1 IMPLANT
DRAPE C-ARMOR (DRAPES) ×1 IMPLANT
DRAPE INCISE IOBAN 66X45 STRL (DRAPES) IMPLANT
DRAPE U-SHAPE 47X51 STRL (DRAPES) ×1 IMPLANT
DRILL QC 2.0 SHORT EVOS SM (DRILL) ×1 IMPLANT
DRILL QC 2.5MM SHORT EVOS SM (DRILL) ×1 IMPLANT
DURAPREP 26ML APPLICATOR (WOUND CARE) ×1 IMPLANT
ELECT CAUTERY BLADE 6.4 (BLADE) ×1 IMPLANT
ELECT REM PT RETURN 9FT ADLT (ELECTROSURGICAL) ×1 IMPLANT
ELECTRODE REM PT RTRN 9FT ADLT (ELECTROSURGICAL) ×1 IMPLANT
GAUZE SPONGE 4X4 12PLY STRL (GAUZE/BANDAGES/DRESSINGS) ×1 IMPLANT
GAUZE XEROFORM 5X9 LF (GAUZE/BANDAGES/DRESSINGS) ×1 IMPLANT
GLOVE BIOGEL PI IND STRL 7.0 (GLOVE) ×2 IMPLANT
GLOVE BIOGEL PI IND STRL 7.5 (GLOVE) ×3 IMPLANT
GLOVE ECLIPSE 7.0 STRL STRAW (GLOVE) ×1 IMPLANT
GLOVE SKINSENSE STRL SZ7.5 (GLOVE) ×1 IMPLANT
GLOVE SURG SYN 7.5 E (GLOVE) ×2 IMPLANT
GLOVE SURG SYN 7.5 PF PI (GLOVE) ×2 IMPLANT
GLOVE SURG UNDER POLY LF SZ7 (GLOVE) ×19 IMPLANT
GLOVE SURG UNDER POLY LF SZ7.5 (GLOVE) ×2 IMPLANT
GOWN STRL SURGICAL XL XLNG (GOWN DISPOSABLE) ×1 IMPLANT
K-WIRE FX150X1.6XTROC PNT (WIRE) ×2 IMPLANT
KIT BASIN OR (CUSTOM PROCEDURE TRAY) ×1 IMPLANT
KIT INVISIKNOT ANKLE FRACTURE (Screw) IMPLANT
KIT TURNOVER KIT B (KITS) ×1 IMPLANT
KWIRE FX150X1.6XTROC PNT (WIRE) IMPLANT
NDL HYPO 25GX1X1/2 BEV (NEEDLE) IMPLANT
NEEDLE HYPO 25GX1X1/2 BEV (NEEDLE) IMPLANT
NS IRRIG 1000ML POUR BTL (IV SOLUTION) ×1 IMPLANT
PACK ORTHO EXTREMITY (CUSTOM PROCEDURE TRAY) ×1 IMPLANT
PAD ARMBOARD POSITIONER FOAM (MISCELLANEOUS) ×2 IMPLANT
PADDING CAST COTTON 6X4 STRL (CAST SUPPLIES) ×1 IMPLANT
PADDING CAST SYNTHETIC 4X4 STR (CAST SUPPLIES) IMPLANT
PLATE FIB EVOS 2.7/3.5 7H R103 (Plate) IMPLANT
SCREW CORT 2.7X14 T8 EVOS (Screw) IMPLANT
SCREW CORT 2.7X15 T8 ST EVOS (Screw) IMPLANT
SCREW CORT 2.7X16 STAR T8 EVOS (Screw) IMPLANT
SCREW CORT 3.5X11 ST EVOS (Screw) IMPLANT
SCREW CORT 3.5X14 ST EVOS (Screw) IMPLANT
SCREW CORT EVOS ST 3.5X12 (Screw) IMPLANT
SCREW LOCK 2.7X13 ST EVOS (Screw) IMPLANT
SPLINT PLASTER CAST XFAST 5X30 (CAST SUPPLIES) IMPLANT
SUCTION TUBE FRAZIER 10FR DISP (SUCTIONS) ×1 IMPLANT
SUT ETHILON 3 0 PS 1 (SUTURE) IMPLANT
SUT VIC AB 2-0 CT1 TAPERPNT 27 (SUTURE) IMPLANT
SYR CONTROL 10ML LL (SYRINGE) IMPLANT
TOWEL GREEN STERILE (TOWEL DISPOSABLE) ×2 IMPLANT
TOWEL GREEN STERILE FF (TOWEL DISPOSABLE) ×1 IMPLANT
TUBE CONNECTING 12X1/4 (SUCTIONS) ×1 IMPLANT
UNDERPAD 30X36 HEAVY ABSORB (UNDERPADS AND DIAPERS) ×1 IMPLANT

## 2023-06-17 NOTE — ED Triage Notes (Signed)
 Pt here from home with c/o right ankle pain and deformity ,results from a syncopal episode last night , no chest pain or sob now , doesn't remember anything after getting up from toilet last night

## 2023-06-17 NOTE — Transfer of Care (Signed)
 Immediate Anesthesia Transfer of Care Note  Patient: Johnny Gonzalez  Procedure(s) Performed: OPEN REDUCTION INTERNAL FIXATION (ORIF) ANKLE FRACTURE (Right: Ankle)  Patient Location: PACU  Anesthesia Type:General  Level of Consciousness: awake, alert , and oriented  Airway & Oxygen Therapy: Patient Spontanous Breathing and Patient connected to nasal cannula oxygen  Post-op Assessment: Report given to RN and Post -op Vital signs reviewed and stable  Post vital signs: Reviewed and stable  Last Vitals:  Vitals Value Taken Time  BP 124/65 06/17/23 1925  Temp    Pulse 74 06/17/23 1930  Resp 16 06/17/23 1930  SpO2 95 % 06/17/23 1930  Vitals shown include unfiled device data.  Last Pain:  Vitals:   06/17/23 1710  TempSrc:   PainSc: 0-No pain         Complications: No notable events documented.

## 2023-06-17 NOTE — Anesthesia Procedure Notes (Signed)
 Anesthesia Regional Block: Adductor canal block   Pre-Anesthetic Checklist: , timeout performed,  Correct Patient, Correct Site, Correct Laterality,  Correct Procedure, Correct Position, site marked,  Risks and benefits discussed,  Pre-op evaluation,  At surgeon's request and post-op pain management  Laterality: Right  Prep: Maximum Sterile Barrier Precautions used, chloraprep       Needles:  Injection technique: Single-shot  Needle Type: Echogenic Stimulator Needle     Needle Length: 9cm  Needle Gauge: 21     Additional Needles:   Procedures:,,,, ultrasound used (permanent image in chart),,    Narrative:  Start time: 06/17/2023 5:09 PM End time: 06/17/2023 5:12 PM Injection made incrementally with aspirations every 5 mL.  Performed by: Personally  Anesthesiologist: Gaynelle Adu, MD

## 2023-06-17 NOTE — Progress Notes (Signed)
 Orthopedic Tech Progress Note Patient Details:  Johnny Gonzalez 14-Oct-1955 [id number]  Ortho Devices Type of Ortho Device: Stirrup splint, Post (short leg) splint Ortho Device/Splint Location: RLE Ortho Device/Splint Interventions: Ordered, Application, Adjustment   Post Interventions Patient Tolerated: Well  [name] 06/17/2023, 12:16 PM

## 2023-06-17 NOTE — Anesthesia Preprocedure Evaluation (Addendum)
 Anesthesia Evaluation  Patient identified by MRN, date of birth, ID band Patient awake    Reviewed: Allergy & Precautions, H&P , NPO status , Patient's Chart, lab work & pertinent test results  Airway Mallampati: III  TM Distance: >3 FB Neck ROM: Full    Dental no notable dental hx. (+) Teeth Intact, Dental Advisory Given   Pulmonary neg pulmonary ROS   Pulmonary exam normal breath sounds clear to auscultation       Cardiovascular hypertension, Pt. on medications  Rhythm:Regular Rate:Normal     Neuro/Psych negative neurological ROS  negative psych ROS   GI/Hepatic negative GI ROS, Neg liver ROS,,,  Endo/Other  negative endocrine ROS    Renal/GU negative Renal ROS  negative genitourinary   Musculoskeletal   Abdominal   Peds  Hematology  (+) Blood dyscrasia, anemia   Anesthesia Other Findings   Reproductive/Obstetrics negative OB ROS                             Anesthesia Physical Anesthesia Plan  ASA: 2  Anesthesia Plan: General   Post-op Pain Management: Regional block* and Tylenol PO (pre-op)*   Induction: Intravenous  PONV Risk Score and Plan: 2 and Ondansetron, Dexamethasone and Midazolam  Airway Management Planned: LMA  Additional Equipment:   Intra-op Plan:   Post-operative Plan: Extubation in OR  Informed Consent: I have reviewed the patients History and Physical, chart, labs and discussed the procedure including the risks, benefits and alternatives for the proposed anesthesia with the patient or authorized representative who has indicated his/her understanding and acceptance.     Dental advisory given  Plan Discussed with: CRNA  Anesthesia Plan Comments:        Anesthesia Quick Evaluation

## 2023-06-17 NOTE — Op Note (Addendum)
 Orthopedic Surgery Operative Report   Procedure: Right bimalleolar ankle fracture open reduction internal fixation Right open reduction of the syndesmosis with placement of syndesmotic fixation Application of short leg splint   Modifier: none   Date of procedure: 06/13/2023   Patient name: Johnny Gonzalez   MRN: [id number]  DOB: 26-May-1955   Surgeon: Willia Craze, MD Assistant: None Pre-operative diagnosis: right bimalleolar ankle fracture Post-operative diagnosis: same as above Findings: displaced lateral and posterior malleolus fracture with medial clear space widening, medial clear space widening seen on stress test after lateral fixation performed   Specimens: none Anesthesia: adductor canal and popliteal block, MAC EBL: 50cc Complications: none Pre-incision antibiotic: ancef TXA given prior to incision as well   Implants:  Implant Name Type Inv. Item Serial No. Manufacturer Lot No. LRB No. Used Action  PLATE FIB EVOS 4.0/9.8 7H R103 - [id number] Plate PLATE FIB EVOS 6.2/1.3 7H R103  [employer]  Right 1 Implanted  SCREW CORT 3.5X14 ST EVOS - [id number] Screw SCREW CORT 3.5X14 ST EVOS  [employer]  Right 1 Implanted  SCREW CORT 3.5X11 ST EVOS - [id number] Screw SCREW CORT 3.5X11 ST EVOS  [employer]  Right 1 Implanted  SCREW CORT EVOS ST 3.5X12 - [id number] Screw SCREW CORT EVOS ST 3.5X12  [employer]  Right 1 Implanted  SCREW CORT 2.7X15 T8 ST EVOS - [id number] Screw SCREW CORT 2.7X15 T8 ST EVOS  [employer]  Right 1 Implanted  SCREW CORT 2.7X14 T8 EVOS - [id number] Screw SCREW CORT 2.7X14 T8 EVOS  [employer]  Right 2 Implanted  SCREW CORT 2.7X16 STAR T8 EVOS - [id number] Screw SCREW CORT 2.7X16 STAR T8 EVOS  [employer]  Right 2 Implanted  SCREW LOCK 2.7X13 ST EVOS - [id number] Screw SCREW LOCK 2.7X13 ST EVOS  [employer]  Right 2 Implanted   KIT INVISIKNOT ANKLE FRACTURE - [id number] Screw KIT INVISIKNOT ANKLE FRACTURE  [employer] [id number] Right 1 Implanted       Indication for procedure: Patient is a 68 y.o. male who presented to the ER after a fall at home. The patient had right ankle pain and x-rays revealed a right bimalleolar ankle fracture with subluxation.  A closed reduction was attempted by the ER staff at [facility] but was unsuccessful.  Orthopedics was consulted and recommended transfer to [facility] for operative management.  Patient was still complaining of right ankle pain with no pain elsewhere.  I discussed his ankle fracture with him.  I recommended operative management.  The risks, benefits, alternatives of surgical management were covered with the patient.  After our discussion, patient elected to proceed with surgery.    Procedure Description: The patient was met in the pre-operative holding area. The patient's identity and consent were verified. The operative site was marked by myself. A block was performed by anesthesia staff.  The patient's remaining questions about the surgery were answered. The patient was brought back to the operating room. The patient was moved to the operating table in the supine position. Anesthesia was induced and a LMA was placed by anesthesia. All bony prominences were well padded.  A bump was placed under the right hip. The surgical area was cleansed with alcohol. Ancef and TXA were administered by anesthesia. The patient's skin was then prepped and draped in a standard, sterile fashion. A time out was performed that identified the patient, the procedure,  and the operative site. All team members agreed with what was stated in the time out.    A longitudinal incision was made over the distal fibula. It was taken sharply down to bone distally. More proximally, the incision was taken sharply down through skin and dermis. Then, a scissors was used to dissect deeper. The superficial  peroneal nerve was identified. Blunt dissection was carried posterior to it. Then, the blunt end of the senn rake was used to move the nerve out of the way as a knife was used to sharply dissect down to the level of the bone. A 15 blade knife was used to clean off the fracture edges. Soft tissue was removed from the fracture site. A reduction maneuver was then performed and a pointed reduction clamp was used to hold the reduction in place. The fracture was well reduced on inspection. A distal fibula locking plate was then placed over the distal fibula. It appeared to be the appropriate size plate. A drill was then used to a hole through the plate more proximal to the fracture. A depth gauge was used to measure the screw length. That size 3.50mm cortical screw was then inserted. The same process was used to place 2 more cortical screws proximal to the fracture at the proximal aspect of the plate. All screws had good purchase.   Attention was then turned to the distal fixation. The locking guide was placed over one of the distal locking holes and the bone was drilled unicortically. A depth gauge was then used to estimate the screw length. That length 2.10mm locking screw was then inserted into the plate. The same process was repeated to insert five more distal locking screws. The pointed reduction clamp was then removed.   AP and lateral fluoroscopic images confirmed satisfactory position of the fixation and reduction of the fracture. A mortise view was then obtained. Stress was then applied to the ankle joint which demonstrated medial clear space widening. Decision was made to proceed with syndesmotic fixation. A 15 blade was used to dissect of the anterior edge of the fibula. This dissection was continued until the syndesmosis could be visualized. The syndesmosis was then reduced with manual pressure. A K wire was placed from the fibula into the tibia to hold the reduction. A beath pin was then used to drill  through both cortices of the fibula and both cortices of the tibia. This was done under AP fluoroscopic guidance to get keep the beath pin parallel to the joint. A small incision was made over the medial ankle to pass the beath pin through the skin. The invasknot was then passed across the syndesmosis with the beath pin. The button was then flipped over the medial cortex of the tibia. The invisaknot was then tightened down into the lateral plate. Several knots were then thrown to secure the invasknot and syndesmosis reduction in place. The tails of the invasknot were then cut. Final AP and lateral fluoroscopic images were then taken which confirmed satisfactory position of the fixation and reduction of the fracture. A mortise view was then obtained which showed no medial clear space widening and the talus well reduced under the tibia. Manual stress was applied to the ankle joint and no clear space widening was seen.    The wound was then copiously irrigated with sterile saline. Hemostasis was achieved. The deep dermal layer was closed with 2-0 vicryl. The skin was closed with 3-0 nylon in a horizontal mattress fashion. The wound was dressed  with xeroform, gauze, and sterile webril. All counts were correct at the end of the case. A well padded  short leg splint was then applied over the leg.    Post-operative plan: The patient will recover in the post-anesthesia care unit and then go to the floor. The patient will receive two post-operative doses of ancef and another dose of TXA. The patient will be non-weight bearing on the right lower extremity. The patient will work with physical therapy. The patient will likely discharge to home tomorrow.     Willia Craze, MD Orthopedic Surgeon

## 2023-06-17 NOTE — Progress Notes (Signed)
 Orthopedic Surgery Progress Note   Assessment: Patient is a 68 y.o. male with right ankle fracture s/p ORIF   Plan: -Operative plans: complete -Diet: regular -DVT ppx: aspirin 81mg  BID -Antibiotics: ancef x2 post-op doses -Weight bearing status: NWB RLE in splint -PT evaluate and treat -Pain control -Dispo: to floor from PACU  ___________________________________________________________________________  Subjective: No acute events since surgery. Not having any pain in the ankle. No sensation in the ankle since the block.   Physical Exam:  General: no acute distress, appears stated age Neurologic: alert, answering questions appropriately, following commands Respiratory: unlabored breathing on room air, symmetric chest rise Psychiatric: appropriate affect, normal cadence to speech  MSK:   -Right lower extremity  Splint in place No sensorimotor exam due to block Foot warm and well perfused, palpable DP pulse   Patient name: Johnny Gonzalez Patient MRN: [id number] Date: 06/17/23

## 2023-06-17 NOTE — ED Notes (Signed)
 Consent signed for moderate sedation with right ankle reduction. E-signature unavailable, printed and signed and placed for scanning to chart.

## 2023-06-17 NOTE — H&P (Addendum)
 Orthopedic Surgery H&P Note  Assessment: Patient is a 68 y.o. male with right bimalleolar ankle fracture   Plan: -Planning for operative management tonight -Diet: NPO for procedure -DVT ppx: aspirin 81mg  BID post-operatively -Ancef and TXA on call to OR -Weight bearing status: NWB RLE -PT evaluate and treat post-operatively -Pain control -Dispo: pending completion of operative plans   Discussed recommendation for operative intervention in the form of right ankle fracture open reduction internal fixation. Explained the risks of this procedure included, but were not limited to: nonunion, malunion, hardware failure/malposition, infection, bleeding, stiffness, posttraumatic arthritis, need for additional procedures, deep vein thrombosis, pulmonary embolism, and death. The benefits of this procedure would be to promote fracture healing by providing stability and to heal the fracture in the appropriate alignment. The alternatives of this surgery would be to treat the fracture with immobilization in a splint/brace/cast or to do no intervention. The patient's questions were answered to his satisfaction. After this discussion, patient elected to proceed with surgery. Informed consent was obtained.   ___________________________________________________________________________   Chief complaint: right ankle pain  History:  Patient is a 68 y.o. male who had food poisoning was experiencing diarrhea. During one time on the toilet, he got up and got lightheaded. He fell to the ground as due to the lightheadedness. He was then on the bathroom floor with significant right ankle pain. He was unable to weight bear and noted deformity at the ankle. He was brought to the [facility]. He was transferred to [facility] for further management. Reporting right ankle pain. No pain elsewhere. Denies numbness and paresthesias in his feet.  Review of systems: General: denies fevers and chills, myalgias Neurologic: denies  recent changes in vision, slurred speech Abdomen: denies nausea, vomiting, hematemesis Respiratory: denies cough, shortness of breath  Past medical history:  Chronic back pain HLD HTN  Allergies: penicillin   Past surgical history:  Tonsillectomy Left THA Left TSA Left ACL reconstruction  Social history: Denies use of nicotine-containing products (cigarettes, vaping, smokeless, etc.) Alcohol use: denies Denies use of recreational drugs  Family history: -reviewed and not pertinent to ankle fracture   Physical Exam:  BMI of 31.0  General: no acute distress, appears stated age Neurologic: alert, answering questions appropriately, following commands Cardiovascular: regular rate, no cyanosis Respiratory: unlabored breathing on room air, symmetric chest rise Psychiatric: appropriate affect, normal cadence to speech  MSK:   -Bilateral upper extremities  No tenderness to palpation over extremity, no gross deformity, no open wounds Fires deltoid, biceps, triceps, wrist extensors, wrist flexors, finger extensors, finger flexors  AIN/PIN/IO intact  Palpable radial pulse  Sensation intact to light touch in median/ulnar/radial/axillary nerve distributions  Hand warm and well perfused  -Left lower extremity  No tenderness to palpation over extremity, no gross deformity, no open wounds, no pain with log roll Fires hip flexors, quadriceps, hamstrings, tibialis anterior, gastrocnemius and soleus, extensor hallucis longus Plantarflexes and dorsiflexes toes Sensation intact to light touch in sural, saphenous, tibial, deep peroneal, and superficial peroneal nerve distributions Foot warm and well perfused, palpable DP pulse  -Right lower extremity  No tenderness to palpation over extremity except over the ankle, no hip pain with log roll, abrasion over the ankle, no persistent ooze or bleeding from the abrasion, gross deformity at the ankle, no other gross deformity Fires hip  flexors, quadriceps, hamstrings, tibialis anterior, gastrocnemius and soleus, extensor hallucis longus Plantarflexes and dorsiflexes toes Sensation intact to light touch in sural, saphenous, tibial, deep peroneal, and superficial peroneal  nerve distributions Foot warm and well perfused, palpable DP pulse  Imaging: XRs of the right from 06/17/2023 were independently reviewed and interpreted, showing small posterior malleolus fracture and an oblique distal fibula fracture. The ankle joint is subluxated laterally with increased medial clear space.    Patient name: Johnny Gonzalez Patient MRN: [id number] Date: 06/17/23  Pre-operative Scores  VAS leg: 8/10

## 2023-06-17 NOTE — Anesthesia Procedure Notes (Signed)
 Procedure Name: LMA Insertion Date/Time: 06/17/2023 5:32 PM  Performed by: Sandie Ano, CRNAPre-anesthesia Checklist: Patient identified, Emergency Drugs available, Suction available and Patient being monitored Patient Re-evaluated:Patient Re-evaluated prior to induction Oxygen Delivery Method: Circle System Utilized Preoxygenation: Pre-oxygenation with 100% oxygen Induction Type: IV induction Ventilation: Mask ventilation without difficulty LMA: LMA inserted LMA Size: 5.0 Number of attempts: 1 Airway Equipment and Method: Bite block Placement Confirmation: positive ETCO2 Tube secured with: Tape Dental Injury: Teeth and Oropharynx as per pre-operative assessment

## 2023-06-17 NOTE — Brief Op Note (Signed)
 06/17/2023  7:57 PM  PATIENT:  Johnny Gonzalez  68 y.o. male  PRE-OPERATIVE DIAGNOSIS:  RIGHT ANKLE FX  POST-OPERATIVE DIAGNOSIS:  RIGHT ANKLE FX  PROCEDURE:  Procedure(s): OPEN REDUCTION INTERNAL FIXATION (ORIF) ANKLE FRACTURE (Right)  SURGEON:  Surgeons and Role:    London Sheer, MD - Primary  PHYSICIAN ASSISTANT: none  ASSISTANTS: none   ANESTHESIA:   none  EBL:  50 mL   BLOOD ADMINISTERED:none  DRAINS: none   LOCAL MEDICATIONS USED:  NONE  SPECIMEN:  No Specimen  DISPOSITION OF SPECIMEN:  N/A  COUNTS:  YES  TOURNIQUET:  NONE  DICTATION: .Note written in EPIC  PLAN OF CARE: Admit for overnight observation  PATIENT DISPOSITION:  PACU - hemodynamically stable.   Delay start of Pharmacological VTE agent (>24hrs) due to surgical blood loss or risk of bleeding: no

## 2023-06-17 NOTE — ED Notes (Signed)
 [facility] and Art therapist CN [facility] aware of transport.

## 2023-06-17 NOTE — ED Notes (Signed)
 Xray called

## 2023-06-17 NOTE — ED Provider Notes (Addendum)
 [place] [facility]ERGENCY DEPARTMENT AT [facility] Provider Note   CSN: [id number] Arrival date & time: 06/17/23  1007     History  Chief Complaint  Patient presents with   Loss of Consciousness   Ankle Pain    Johnny Gonzalez is a 68 y.o. male.  68 year old male presents with right ankle injury.  Patient states that yesterday evening he was having vomiting diarrhea.  Thinks he may have had some bad food.  Was sitting on the toilet where he had a brief syncopal event.  Was sitting on the toilet and became diaphoretic had a buzzing in his ears fell to the floor.  When he woke up he felt lightheaded and dizzy.  We tried to stand up he inverted his foot on the right side.  Has severe pain.  Denies any dizziness at this time.  He takes Phenergan for his vomiting.  Denies any abdominal pain      Home Medications Prior to Admission medications   Medication Sig Start Date End Date Taking? Authorizing Provider  acetaminophen (TYLENOL) 500 MG tablet Take 1,000 mg by mouth every 6 (six) hours as needed for moderate pain.    [provider]  calcium carbonate (TUMS - DOSED IN MG ELEMENTAL CALCIUM) 500 MG chewable tablet Chew 2 tablets by mouth 3 (three) times daily as needed for indigestion or heartburn.    [provider]  methocarbamol (ROBAXIN) 500 MG tablet Take 1 tablet (500 mg total) by mouth every 6 (six) hours as needed for muscle spasms. 05/15/19   Beverely Low, MD  oxyCODONE (OXY IR/ROXICODONE) 5 MG immediate release tablet Take 1-2 tablets (5-10 mg total) by mouth every 4 (four) hours as needed for severe pain or breakthrough pain. 05/15/19   Beverely Low, MD  rivaroxaban (XARELTO) 10 MG TABS tablet Take 1 tablet (10 mg total) by mouth daily with breakfast. Patient not taking: Reported on 05/05/2019 09/19/13   Ollen Gross, MD  telmisartan-hydrochlorothiazide (MICARDIS HCT) 80-25 MG tablet Take 1 tablet by mouth daily. 04/09/19   [provider]   traMADol (ULTRAM) 50 MG tablet Take 1-2 tablets (50-100 mg total) by mouth every 6 (six) hours as needed for moderate pain. 05/15/19   Beverely Low, MD      Allergies    Penicillins    Review of Systems   Review of Systems  All other systems reviewed and are negative.   Physical Exam Updated Vital Signs BP 124/65 (BP Location: Right Arm)   Pulse 77   Temp 98 F (36.7 C) (Oral)   Resp 18   Ht 1.854 m (6\' 1" )   Wt 106.6 kg   SpO2 98%   BMI 31.00 kg/m  Physical Exam Vitals and nursing note reviewed.  Constitutional:      General: He is not in acute distress.    Appearance: Normal appearance. He is well-developed. He is not toxic-appearing.  HENT:     Head: Normocephalic and atraumatic.  Eyes:     General: Lids are normal.     Conjunctiva/sclera: Conjunctivae normal.     Pupils: Pupils are equal, round, and reactive to light.  Neck:     Thyroid: No thyroid mass.     Trachea: No tracheal deviation.  Cardiovascular:     Rate and Rhythm: Normal rate and regular rhythm.     Heart sounds: Normal heart sounds. No murmur heard.    No gallop.  Pulmonary:     Effort: Pulmonary effort is normal.  No respiratory distress.     Breath sounds: Normal breath sounds. No stridor. No decreased breath sounds, wheezing, rhonchi or rales.  Abdominal:     General: There is no distension.     Palpations: Abdomen is soft.     Tenderness: There is no abdominal tenderness. There is no rebound.  Musculoskeletal:     Cervical back: Normal range of motion and neck supple.     Right knee: Normal range of motion. Tenderness present.     Right ankle: Swelling and deformity present. Decreased range of motion.       Legs:     Comments: Neurovascular status intact at right foot.  Skin:    General: Skin is warm and dry.     Findings: No abrasion or rash.  Neurological:     Mental Status: He is alert and oriented to person, place, and time. Mental status is at baseline.     GCS: GCS eye subscore  is 4. GCS verbal subscore is 5. GCS motor subscore is 6.     Cranial Nerves: No cranial nerve deficit.     Sensory: No sensory deficit.     Motor: Motor function is intact.  Psychiatric:        Attention and Perception: Attention normal.        Speech: Speech normal.        Behavior: Behavior normal.    ED Results / Procedures / Treatments   Labs (all labs ordered are listed, but only abnormal results are displayed) Labs Reviewed  CBC WITH DIFFERENTIAL/PLATELET  COMPREHENSIVE METABOLIC PANEL    EKG EKG Interpretation Date/Time:  Monday June 17 2023 10:17:15 EDT Ventricular Rate:  80 PR Interval:  140 QRS Duration:  113 QT Interval:  392 QTC Calculation: 453 R Axis:   72  Text Interpretation: Sinus rhythm Borderline intraventricular conduction delay Low voltage, extremity and precordial leads Confirmed by Lorre Nick ([id number]) on 06/17/2023 10:24:01 AM  Radiology No results found.  Procedures .Sedation  Date/Time: 06/17/2023 12:24 PM  Performed by: Lorre Nick, MD Authorized by: Lorre Nick, MD   Consent:    Consent obtained:  Verbal   Consent given by:  Patient   Risks discussed:  Allergic reaction, dysrhythmia, inadequate sedation, nausea, prolonged hypoxia resulting in organ damage, prolonged sedation necessitating reversal, respiratory compromise necessitating ventilatory assistance and intubation and vomiting   Alternatives discussed:  Analgesia without sedation, anxiolysis and regional anesthesia Universal protocol:    Procedure explained and questions answered to patient or proxy's satisfaction: yes     Relevant documents present and verified: yes     Test results available: yes     Imaging studies available: yes     Required blood products, implants, devices, and special equipment available: yes     Site/side marked: yes     Immediately prior to procedure, a time out was called: yes     Patient identity confirmed:  Verbally with patient Indications:     Procedure necessitating sedation performed by:  Physician performing sedation Pre-sedation assessment:    Time since last food or drink:  12 hours   ASA classification: class 1 - normal, healthy patient     Mouth opening:  3 or more finger widths   Thyromental distance:  4 finger widths   Mallampati score:  I - soft palate, uvula, fauces, pillars visible   Neck mobility: normal     Pre-sedation assessments completed and reviewed: airway patency, cardiovascular function, hydration status, mental status, nausea/vomiting, pain  level, respiratory function and temperature   A pre-sedation assessment was completed prior to the start of the procedure Immediate pre-procedure details:    Reassessment: Patient reassessed immediately prior to procedure     Reviewed: vital signs, relevant labs/tests and NPO status     Verified: bag valve mask available, emergency equipment available, intubation equipment available, IV patency confirmed, oxygen available and suction available   Procedure details (see MAR for exact dosages):    Preoxygenation:  Nasal cannula   Sedation:  Propofol   Intended level of sedation: deep   Analgesia:  Fentanyl   Intra-procedure monitoring:  Blood pressure monitoring, cardiac monitor, continuous pulse oximetry, frequent LOC assessments, frequent vital sign checks and continuous capnometry   Intra-procedure events: none     Total Provider sedation time (minutes):  15 Post-procedure details:   A post-sedation assessment was completed following the completion of the procedure.   Attendance: Constant attendance by certified staff until patient recovered     Recovery: Patient returned to pre-procedure baseline     Post-sedation assessments completed and reviewed: airway patency, cardiovascular function, hydration status, mental status, nausea/vomiting, pain level, respiratory function and temperature     Patient is stable for discharge or admission: yes     Procedure  completion:  Tolerated well, no immediate complications .Reduction of dislocation  Date/Time: 06/17/2023 12:25 PM  Performed by: Lorre Nick, MD Authorized by: Lorre Nick, MD  Consent: Written consent obtained. Risks and benefits: risks, benefits and alternatives were discussed Time out: Immediately prior to procedure a "time out" was called to verify the correct patient, procedure, equipment, support staff and site/side marked as required.  Sedation: Patient sedated: yes Sedatives: propofol Analgesia: fentanyl Sedation start date/time: 06/17/2023 12:00 PM Sedation end date/time: 06/17/2023 12:15 PM Vitals: Vital signs were monitored during sedation.  Patient tolerance: patient tolerated the procedure well with no immediate complications       Medications Ordered in ED Medications  0.9 %  sodium chloride infusion (has no administration in time range)  sodium chloride 0.9 % bolus 1,000 mL (has no administration in time range)  ceFAZolin (ANCEF) IVPB 2g/100 mL premix (has no administration in time range)  fentaNYL (SUBLIMAZE) injection 100 mcg (has no administration in time range)  ondansetron (ZOFRAN) injection 4 mg (has no administration in time range)    ED Course/ Medical Decision Making/ A&P                                 Medical Decision Making Amount and/or Complexity of Data Reviewed Labs: ordered. Radiology: ordered. ECG/medicine tests: ordered.  Risk Prescription drug management.   Patient's EKG shows sinus rhythm.  Patient likely with vagal episode last night.  Labs are reassuring.  Patient likely had a vagal episode as a cause of her syncope. X-ray of right ankle shows a trimalleolar fracture on the right side. Discussed with Dr. Christell Constant from orthopedic surgery.  Concern for possible open ankle injury.  Patient given 2 g of Ancef.  Likely an abrasion.  Attempted reduction of patient's fracture and was not successful based on repeat x-ray which still shows  disruption of patient's ankle mortise.  Will recontact Dr. Christell Constant and patient will be sent to [facility] where he will admit the patient   CRITICAL CARE Performed by: Toy Baker Total critical care time: 45 minutes Critical care time was exclusive of separately billable procedures and treating other patients. Critical  care was necessary to treat or prevent imminent or life-threatening deterioration. Critical care was time spent personally by me on the following activities: development of treatment plan with patient and/or surrogate as well as nursing, discussions with consultants, evaluation of patient's response to treatment, examination of patient, obtaining history from patient or surrogate, ordering and performing treatments and interventions, ordering and review of laboratory studies, ordering and review of radiographic studies, pulse oximetry and re-evaluation of patient's condition.         Final Clinical Impression(s) / ED Diagnoses Final diagnoses:  None    Rx / DC Orders ED Discharge Orders     None         Lorre Nick, MD 06/17/23 1229    Lorre Nick, MD 06/17/23 1240

## 2023-06-17 NOTE — Anesthesia Procedure Notes (Signed)
 Anesthesia Regional Block: Popliteal block   Pre-Anesthetic Checklist: , timeout performed,  Correct Patient, Correct Site, Correct Laterality,  Correct Procedure, Correct Position, site marked,  Risks and benefits discussed,  Pre-op evaluation,  At surgeon's request and post-op pain management  Laterality: Right  Prep: Maximum Sterile Barrier Precautions used, chloraprep       Needles:  Injection technique: Single-shot  Needle Type: Echogenic Stimulator Needle     Needle Length: 9cm  Needle Gauge: 21     Additional Needles:   Procedures:,,,, ultrasound used (permanent image in chart),,    Narrative:  Start time: 06/17/2023 5:02 PM End time: 06/17/2023 5:09 PM Injection made incrementally with aspirations every 5 mL.  Performed by: Personally  Anesthesiologist: Gaynelle Adu, MD

## 2023-06-17 NOTE — Anesthesia Postprocedure Evaluation (Signed)
 Anesthesia Post Note  Patient: Johnny Gonzalez  Procedure(s) Performed: OPEN REDUCTION INTERNAL FIXATION (ORIF) ANKLE FRACTURE (Right: Ankle)     Patient location during evaluation: PACU Anesthesia Type: General and Regional Level of consciousness: awake Pain management: pain level controlled Vital Signs Assessment: post-procedure vital signs reviewed and stable Respiratory status: spontaneous breathing, nonlabored ventilation and respiratory function stable Cardiovascular status: blood pressure returned to baseline and stable Postop Assessment: no apparent nausea or vomiting Anesthetic complications: no   No notable events documented.  Last Vitals:  Vitals:   06/17/23 1945 06/17/23 2000  BP: 125/64 114/70  Pulse: 72 67  Resp: 16 16  Temp:  36.6 C  SpO2: 94% 93%    Last Pain:  Vitals:   06/17/23 1945  TempSrc:   PainSc: 0-No pain                 Meena Barrantes P Keyaan Lederman

## 2023-06-17 NOTE — Discharge Instructions (Addendum)
 Orthopedic Surgery Discharge Instructions  Patient name: Johnny Gonzalez Fracture: right bimalleolar ankle fracture Procedure Performed: right ankle fracture open reduction internal fixation Date of Surgery: 06/17/2022 Surgeon: Willia Craze, MD  Activity: You should not put any weight down on your right lower extremity. The plates and screws in your leg are not designed for weight bearing and putting weight down could risk injury to your surgery.   Incision Care: Your incision has a dressing over it that is underneath the splint. The splint should remain on until you are seen for your follow up visit in the office. You need to keep the splint dry at all times. You can place a waterproof bag over the leg to bathe. Your incision has sutures in place that will be removed at your first office appointment with 'Dr. Christell Constant.  Medications: You have been prescribed oxycodone. This is a narcotic pain medication and should only be taken as prescribed. You should not drink alcohol or operate heavy machinery (including driving) while taking this medication. The oxycodone can cause constipation as a side effect. For that reason, you have been prescribed senna and miralax. These are both laxatives. You do not need to take this medication if you develop diarrhea. Should you remain constipated even while taking the senna and miralax, please use the miralax twice daily. Tylenol has been prescribed to be taken every 8 hours, which will give you additional pain relief.   You have been prescribed aspirin as a blood thinner. This medication is to be taken to prevent blood clots. Take 81 milligrams twice daily. You should refrain from using other blood thinners (warfarin, apixaban, plavix, xarelto, etc.) while using the aspirin. You will need to take this medication for a total of 6 weeks after your surgery.   You should not use over-the-counter NSAIDs (ibuprofen, Aleve, Celebrex, naproxen, meloxicam, etc.) for pain relief  because aspirin is a similar medication. There can be side effects including but not limited to kidney injury and ulcers if you take these type of medications with the aspirin.  In order to set expectations for opioid prescriptions, you will only be prescribed opioids for a total of six weeks after surgery and, at two-weeks after surgery, your opioid prescription will start to tapered (decreased dosage and number of pills). If you have ongoing need for opioid medication six weeks after surgery, you will be referred to pain management. If you are already established with a provider that is giving you opioid medications, you should schedule an appointment with them for six weeks after surgery if you feel you are going to need another prescription. State law only allows for opioid prescriptions one week at a time. If you are running out of opioid medication near the end of the week, please call the office during business hours before running out so I can send you another prescription.   Driving: Sine your injury is to the right ankle, expect to be unable to drive for the next six weeks as your ankle recovers.   Diet: You are safe to resume your regular diet after surgery.   Reasons to Call the Office After Surgery: You should feel free to call the office with any concerns or questions you have in the post-operative period, but you should definitely notify the office if you develop: -shortness of breath, chest pain, or trouble breathing -excessive bleeding, drainage, redness, or swelling around the surgical site -fevers, chills, or pain that is getting worse with each passing day -persistent nausea  or vomiting -new weakness in the right leg, new or worsening numbness or tingling in the right leg -other concerns about your surgery  Follow Up Appointments: You have a follow up visit scheduled with Dr. Christell Constant on 07/01/2023 at 10:30am. Please arrive on time to this appointment. The office location and phone  number are listed below.   Office Information:  -Willia Craze, MD -Phone number: [phone] -Address: [address]       [place], [place] [address]

## 2023-06-17 NOTE — Discharge Summary (Signed)
 Orthopedic Surgery Discharge Summary  Patient name: Johnny Gonzalez Patient MRN: [id number] Admit date: 06/17/2023 Discharge date: 06/18/2023  Attending physician: Willia Craze, MD Final diagnosis: right bimalleolar ankle fracture  Findings: displaced lateral and posterior malleolus fracture with medial clear space widening, medial clear space widening seen on stress test after lateral fixation performed   Hospital course: Patient is a 68 y.o. male who was sustained a right ankle fracture after fall at his home. The patient was admitted to the orthopedic service for planned operative intervention. The patient underwent right ankle fracture open reduction internal fixation on 06/17/2023. The patient had little pain after surgery due to the block that was done by anesthesia prior to surgery. His pain remained well controlled with oral medication during his hospitalization. The patient worked with physical therapy who recommended discharge to home. The patient was tolerating an oral diet without issue and was voiding spontaneously after surgery. The patient's vitals were stable on the day of discharge. The patient was medically ready for discharge and was discharge to home on post-operative day one.  Instructions:   Orthopedic Surgery Discharge Instructions  Patient name: Johnny Gonzalez Fracture: right bimalleolar ankle fracture Procedure Performed: right ankle fracture open reduction internal fixation Date of Surgery: 06/17/2022 Surgeon: Willia Craze, MD  Activity: You should not put any weight down on your right lower extremity. The plates and screws in your leg are not designed for weight bearing and putting weight down could risk injury to your surgery.   Incision Care: Your incision has a dressing over it that is underneath the splint. The splint should remain on until you are seen for your follow up visit in the office. You need to keep the splint dry at all times. You can place a  waterproof bag over the leg to bathe. Your incision has sutures in place that will be removed at your first office appointment with 'Dr. Christell Constant.  Medications: You have been prescribed oxycodone. This is a narcotic pain medication and should only be taken as prescribed. You should not drink alcohol or operate heavy machinery (including driving) while taking this medication. The oxycodone can cause constipation as a side effect. For that reason, you have been prescribed senna and miralax. These are both laxatives. You do not need to take this medication if you develop diarrhea. Should you remain constipated even while taking the senna and miralax, please use the miralax twice daily. Tylenol has been prescribed to be taken every 8 hours, which will give you additional pain relief.   You have been prescribed aspirin as a blood thinner. This medication is to be taken to prevent blood clots. Take 81 milligrams twice daily. You should refrain from using other blood thinners (warfarin, apixaban, plavix, xarelto, etc.) while using the aspirin. You will need to take this medication for a total of 6 weeks after your surgery.   You should not use over-the-counter NSAIDs (ibuprofen, Aleve, Celebrex, naproxen, meloxicam, etc.) for pain relief because aspirin is a similar medication. There can be side effects including but not limited to kidney injury and ulcers if you take these type of medications with the aspirin.  In order to set expectations for opioid prescriptions, you will only be prescribed opioids for a total of six weeks after surgery and, at two-weeks after surgery, your opioid prescription will start to tapered (decreased dosage and number of pills). If you have ongoing need for opioid medication six weeks after surgery, you will be referred to pain management.  If you are already established with a provider that is giving you opioid medications, you should schedule an appointment with them for six weeks after  surgery if you feel you are going to need another prescription. State law only allows for opioid prescriptions one week at a time. If you are running out of opioid medication near the end of the week, please call the office during business hours before running out so I can send you another prescription.   Driving: Sine your injury is to the right ankle, expect to be unable to drive for the next six weeks as your ankle recovers.   Diet: You are safe to resume your regular diet after surgery.   Reasons to Call the Office After Surgery: You should feel free to call the office with any concerns or questions you have in the post-operative period, but you should definitely notify the office if you develop: -shortness of breath, chest pain, or trouble breathing -excessive bleeding, drainage, redness, or swelling around the surgical site -fevers, chills, or pain that is getting worse with each passing day -persistent nausea or vomiting -new weakness in the right leg, new or worsening numbness or tingling in the right leg -other concerns about your surgery  Follow Up Appointments: You have a follow up visit scheduled with Dr. Christell Constant on 07/01/2023 at 10:30am. Please arrive on time to this appointment. The office location and phone number are listed below.   Office Information:  -Willia Craze, MD -Phone number: [phone] -Address: [address]       [place], [place] [address]

## 2023-06-17 NOTE — ED Notes (Signed)
 Pt will be picked up by [facility] between 2-2:30 p to go to short stay for Dr Christell Constant to see.

## 2023-06-17 NOTE — Progress Notes (Signed)
 Respiratory Therapist at [facility]  in room number __WA18__ during procedure.  Suction with [place] at [facility] set up and ready to use. yes Ambu bag at [facility] and ready to use. yes Patient placed on ETCO2 Nasal Cannula at ___2.5_ LPM.  Vitals at conclusion of procedure: yes ETCO2 _43___ mmHg HR 72  RR 18 SPO2 95  Patient awake and able to verbalize name. Yes  Patient had no complications to procedure.

## 2023-06-18 ENCOUNTER — Other Ambulatory Visit (HOSPITAL_COMMUNITY): Payer: Self-pay

## 2023-06-18 ENCOUNTER — Other Ambulatory Visit: Payer: Self-pay

## 2023-06-18 MED ORDER — OXYCODONE HCL 5 MG PO TABS
5.0000 mg | ORAL_TABLET | ORAL | 0 refills | Status: DC | PRN
  Filled 2023-06-18: qty 30, 3d supply, fill #0

## 2023-06-18 NOTE — Progress Notes (Signed)
 Orthopedic Surgery Progress Note   Assessment: Patient is a 68 y.o. male with right ankle fracture s/p ORIF   Plan: -Operative plans: complete -Diet: regular -DVT ppx: aspirin 81mg  BID -Antibiotics: ancef x2 post-op doses -Weight bearing status: NWB RLE in splint -PT evaluate and treat -Pain control -Anticipate discharge to home today  ___________________________________________________________________________  Subjective: No acute events overnight. Was able to get some sleep last night. Not having any pain in his ankle as block is still in effect. States his foot is completely numb.    Physical Exam:  General: no acute distress, appears stated age, watching TV in bed Neurologic: alert, answering questions appropriately, following commands Respiratory: unlabored breathing on room air, symmetric chest rise Psychiatric: appropriate affect, normal cadence to speech  MSK:   -Right lower extremity  Splint in place with no drainage seen No sensorimotor exam due to block Foot warm and well perfused, palpable DP pulse   Patient name: Johnny Gonzalez Patient MRN: [id number] Date: 06/18/23

## 2023-06-18 NOTE — Evaluation (Signed)
 Physical Therapy Evaluation and Discharge Patient Details Name: Johnny Gonzalez MRN: [id number] DOB: 1955/09/29 Today's Date: 06/18/2023  History of Present Illness  Admitted after fall in teh bathroom (lightheaded, presumed dehydration with GI distress) resulting in R ankle fx; s/p surgical repair, NWB RLE;  has a past medical history of Arthritis, Back pain, H/O hiatal hernia, History of shingles, Hyperlipidemia, Hypertension, Joint pain, and Weakness.  Clinical Impression  Patient evaluated by Physical Therapy with no further acute PT needs identified, as the plan is to leave hospital today All necessary education has been completed and the patient has no further questions. Pt was able to ambulate stairs but was shaky at times. Pt offered 2 methods of stair navigation that work for him. 1. Rail on one side and a friend on the other 2. Sit and scoot. See below for any follow-up Physical Therapy or equipment needs. PT is signing off. Thank you for this referral.         If plan is discharge home, recommend the following: A little help with walking and/or transfers;A little help with bathing/dressing/bathroom;Assistance with cooking/housework;Assist for transportation   Can travel by private vehicle        Equipment Recommendations Rolling walker (2 wheels);Crutches  Recommendations for Other Services    The potential need for Outpatient PT can be addressed at Ortho follow-up appointments.    Functional Status Assessment Patient has had a recent decline in their functional status and demonstrates the ability to make significant improvements in function in a reasonable and predictable amount of time.     Precautions / Restrictions Precautions Precautions: Fall Restrictions Weight Bearing Restrictions Per Provider Order: Yes RLE Weight Bearing Per Provider Order: Non weight bearing      Mobility  Bed Mobility Overal bed mobility: Independent                   Transfers Overall transfer level: Needs assistance Equipment used: Rolling walker (2 wheels), Crutches Transfers: Sit to/from Stand Sit to Stand: Supervision           General transfer comment: pt able to transfer sit to/from stand on his own but sometimes felt unstable    Ambulation/Gait Ambulation/Gait assistance: Contact guard assist   Assistive device: Rolling walker (2 wheels), Crutches            Stairs   Stairs assistance: Contact guard assist, Min assist Stair Management: One rail Left, One rail Right, Backwards, With walker, With crutches   General stair comments: demonstrated and had pt perform ascent/descent with B crutches, unilateral crutche and railcues for sequence and to take it slowly; SPTs demonstrated backwards technqiue with RW - pt is not interested in trying this technique; pt tells [place] that he plans to have a buddy help him upthe steps, an dwe discussed sitting down an d"bumnping up", which is a viable option as well  Wheelchair Mobility     Tilt Bed    Modified Rankin (Stroke Patients Only)       Balance Overall balance assessment: Needs assistance Sitting-balance support: Feet supported Sitting balance-Leahy Scale: Good     Standing balance support: Bilateral upper extremity supported Standing balance-Leahy Scale: Poor Standing balance comment: pt is able to balance with BUE support                             Pertinent Vitals/Pain Pain Assessment Pain Assessment: No/denies pain    Home Living Family/patient expects  to be discharged to:: Private residence Living Arrangements: Spouse/significant other Available Help at Discharge: Family Type of Home: House Home Access: Stairs to enter Entrance Stairs-Rails: Lawyer of Steps: 5 Alternate Level Stairs-Number of Steps: 12 Home Layout: Two level Home Equipment: Agricultural consultant (2 wheels);Crutches      Prior Function Prior Level of Function  : Independent/Modified Independent                     Extremity/Trunk Assessment   Upper Extremity Assessment Upper Extremity Assessment: Overall WFL for tasks assessed    Lower Extremity Assessment Lower Extremity Assessment: RLE deficits/detail RLE Deficits / Details: foot and ankle immobilized in splint; Nerve block still in effect; hip and knee [facility]       Communication   Communication Communication: No apparent difficulties    Cognition Arousal: Alert Behavior During Therapy: WFL for tasks assessed/performed   PT - Cognitive impairments: No apparent impairments                         Following commands: Intact       Cueing Cueing Techniques: Verbal cues     General Comments      Exercises Total Joint Exercises Gluteal Sets: AROM, 10 reps, Seated Hip ABduction/ADduction: AROM, 10 reps, Seated, Other (comment) (with gait belt for resistance)   Assessment/Plan    PT Assessment All further PT needs can be met in the next venue of care  PT Problem List Decreased activity tolerance;Decreased balance;Decreased mobility;Decreased knowledge of use of DME       PT Treatment Interventions      PT Goals (Current goals can be found in the Care Plan section)  Acute Rehab PT Goals Patient Stated Goal: Home today PT Goal Formulation: All assessment and education complete, DC therapy    Frequency       Co-evaluation               AM-PAC PT "6 Clicks" Mobility  Outcome Measure Help needed turning from your back to your side while in a flat bed without using bedrails?: None Help needed moving from lying on your back to sitting on the side of a flat bed without using bedrails?: None Help needed moving to and from a bed to a chair (including a wheelchair)?: None Help needed standing up from a chair using your arms (e.g., wheelchair or bedside chair)?: A Little Help needed to walk in hospital room?: A Little Help needed climbing 3-5 steps with  a railing? : A Lot 6 Click Score: 20    End of Session Equipment Utilized During Treatment: Gait belt Activity Tolerance: Patient tolerated treatment well Patient left: in chair;with call bell/phone within reach Nurse Communication: Mobility status PT Visit Diagnosis: Unsteadiness on feet (R26.81);Other abnormalities of gait and mobility (R26.89)    Time:  - 910-945  35 minutes     Charges:   PT Evaluation $PT Eval Low Complexity: 1 Low PT Treatments $Gait Training: 8-22 mins PT General Charges $$ ACUTE PT VISIT: 1 Visit         Van Clines, PT  Acute Rehabilitation Services Office [phone] Secure Chat welcomed   Levi Aland 06/18/2023, 6:19 PM

## 2023-06-19 ENCOUNTER — Encounter (HOSPITAL_COMMUNITY): Payer: Self-pay | Admitting: Orthopedic Surgery

## 2023-06-20 ENCOUNTER — Other Ambulatory Visit (HOSPITAL_COMMUNITY): Payer: Self-pay

## 2023-06-21 ENCOUNTER — Telehealth: Payer: Self-pay | Admitting: Orthopedic Surgery

## 2023-06-21 MED ORDER — OXYCODONE HCL 5 MG PO TABS: 5.0000 mg | ORAL_TABLET | ORAL | 0 refills | Status: AC | PRN

## 2023-06-21 NOTE — Telephone Encounter (Signed)
 Patient called and ask if he could get a refill on Oxycodone. CB#[phone]

## 2023-06-21 NOTE — Addendum Note (Signed)
 Addended by: Willia Craze on: 06/21/2023 03:50 PM   Modules accepted: Orders

## 2023-07-01 ENCOUNTER — Other Ambulatory Visit (INDEPENDENT_AMBULATORY_CARE_PROVIDER_SITE_OTHER): Payer: Self-pay

## 2023-07-01 ENCOUNTER — Ambulatory Visit: Admitting: Orthopedic Surgery

## 2023-07-01 DIAGNOSIS — S82841A Displaced bimalleolar fracture of right lower leg, initial encounter for closed fracture: Secondary | ICD-10-CM

## 2023-07-01 NOTE — Progress Notes (Addendum)
 Orthopedic Surgery Post-operative Office Visit  Procedure: right bimalleolar ankle fracture ORIF Date of Surgery: 06/17/2023 (~2 weeks post-op)  Assessment: Patient is a 68 y.o. who is doing well after surgery   Plan: -Operative plans complete -Sutures removed in office today -Weight bearing status: NWB RLE in boot  -Pain management: OTC medications -DVT ppx: ASA 81mg  BID -Encouraged him to work on ankle dorsiflexion  -Okay to let soap/water run over incision but do not submerge -Return to office in 4 weeks, x-rays needed at next visit: weight bearing AP/lateral/mortise right ankle  ___________________________________________________________________________   Subjective: Patient has been at home since discharge from the hospital. He has been doing well. Block was effective in controlling pain. Said he used oxycodone for 4 days and has not used any since. He is not currently taking any medication for pain. He has not noticed any drainage around his splint. Denies fevers/chills. Has been non weight bearing on the right lower extremity in a splint.   Objective:  General: no acute distress, appropriate affect Neurologic: alert, answering questions appropriately, following commands Respiratory: unlabored breathing on room air Skin: incision is well approximated with no erythema, induration, or active/expressible drainage  MSK (spine): EHL/TA/GSC intact, dorsiflexion to 10 degrees shy of neutral, plantarflexion to 30 degrees, sensation intact to light touch in deep peroneal/superficial peroneal/tibial/saphenous/sural nerve distributions but decreased in deep peroneal and superficial peroneal distributions, palpable DP pulse, foot warm and well perfused  Imaging: X-rays of the right ankle from 07/01/2023 were independently reviewed and interpreted, showing lateral plate fixation over the distal fibula.  No lucency seen around the screws.  There is a suture button over the medial aspect of  the distal tibia that appears in appropriate position.  Ankle mortise is well reduced with no medial clear space widening.  Fracture is reduced and no change in alignment seen from immediate postoperative films on 06/17/2023.   Patient name: Johnny Gonzalez Patient MRN: [id number] Date of visit: 07/01/23

## 2023-07-31 ENCOUNTER — Other Ambulatory Visit (INDEPENDENT_AMBULATORY_CARE_PROVIDER_SITE_OTHER)

## 2023-07-31 ENCOUNTER — Ambulatory Visit: Admitting: Orthopedic Surgery

## 2023-07-31 DIAGNOSIS — S82841A Displaced bimalleolar fracture of right lower leg, initial encounter for closed fracture: Secondary | ICD-10-CM

## 2023-07-31 NOTE — Progress Notes (Signed)
 Orthopedic Surgery Post-operative Office Visit   Procedure: right bimalleolar ankle fracture ORIF Date of Surgery: 06/17/2023 (~6 weeks post-op)   Assessment: Patient is a 68 y.o. who is doing well after surgery     Plan: -Operative plans complete -Weight bearing status: WBAT RLE in boot and transition to regular shoes  -Pain management: tylenol  as needed -Can discontinue DVT ppx  -Okay to submerge wound at this time -Return to office in 6 weeks, x-rays needed at next visit: weight bearing AP/lateral/mortise right ankle   ___________________________________________________________________________     Subjective: Patient has noticed continued decreased in has pain around the ankle. He does have some pain around the malleoli where the boot rests on his ankle. He is not taking any medication for pain. He has not noticed any redness or drainage from his incision. He has been non-weight bearing in a boot.    Objective:   General: no acute distress, appropriate affect Neurologic: alert, answering questions appropriately, following commands Respiratory: unlabored breathing on room air Skin: incision is well healed with no erythema, induration, or active/expressible drainage   MSK (spine): EHL/TA/GSC intact, dorsiflexion to neutral, plantarflexion to 30 degrees, sensation intact to light touch in deep peroneal/superficial peroneal/tibial/saphenous/sural nerve distributions, palpable DP pulse, foot warm and well perfused   Imaging: X-rays of the right ankle from 07/31/2023 were independently reviewed and interpreted, showing maintained lateral malleolus fracture reduction. Ankle and fracture alignment appears similar to immediate post-operative films on 06/17/2023. There is a lateral plate over the distal fibula. No lucency around any of the screws. None of the screws have backed out. A suture button is seen over the medial aspect of the distal tibia. No new fracture seen.      Patient name:  Johnny Gonzalez Patient MRN: [id number] Date of visit: 07/31/23

## 2023-08-02 ENCOUNTER — Telehealth: Payer: Self-pay | Admitting: Orthopedic Surgery

## 2023-08-02 MED ORDER — HYDROCODONE-ACETAMINOPHEN 5-325 MG PO TABS: 1.0000 | ORAL_TABLET | ORAL | 0 refills | Status: AC | PRN

## 2023-08-02 NOTE — Telephone Encounter (Signed)
 Rx refill Oxycodone  5mg       [facility] [address] [place]

## 2023-08-20 NOTE — Progress Notes (Unsigned)
 Cardiology Office Note:    Date:  08/23/2023   ID:  [name], DOB [birth year], MRN [id number]  PCP:  Loree Roe, MD   [facility] Providers Cardiologist:  None     Referring MD: Loree Roe, MD   Chief Complaint  Patient presents with   Chest Pain   Shortness of Breath   Palpitations    History of Present Illness:    Johnny Gonzalez is a 68 y.o. male seen at the request of Dr Gearline Kell for evaluation of palpitations and SOB. Patient reports that he has periods of rapid heart beats associated with some SOB. He feels like something is pulling at his neck. This has occurred off and on for several years. It is worse when under periods of stress. Has actually been better recently. He also notes occasional symptoms of chest discomfort. He is concerned about his cardiac risk but has been reluctant to take statin medication.   Past Medical History:  Diagnosis Date   Arthritis    Back pain    H/O hiatal hernia    History of shingles    Hyperlipidemia    borderline but has lost weight and eating better;takes Fish oil   Hypertension    takes Diovan  daily   Joint pain    Weakness    numbness and tingling related to shoulder problems    Past Surgical History:  Procedure Laterality Date   ADENOIDECTOMY     ARTHROSCOPIC REPAIR ACL Left    COLONOSCOPY     ELBOW SURGERY Right    ORIF ANKLE FRACTURE Right 06/17/2023   Procedure: OPEN REDUCTION INTERNAL FIXATION (ORIF) ANKLE FRACTURE;  Surgeon: Diedra Fowler, MD;  Location: [facility] OR;  Service: Orthopedics;  Laterality: Right;   SHOULDER SURGERY Left    TONSILLECTOMY     TOTAL HIP ARTHROPLASTY Left 09/18/2013   Procedure: LEFT TOTAL HIP ARTHROPLASTY ANTERIOR APPROACH;  Surgeon: Aurther Blue, MD;  Location: [facility] OR;  Service: Orthopedics;  Laterality: Left;   TOTAL SHOULDER ARTHROPLASTY Left 05/15/2019   Procedure: TOTAL SHOULDER ARTHROPLASTY;  Surgeon: Winston Hawking, MD;  Location: [facility] ORS;  Service: Orthopedics;   Laterality: Left;    Current Medications: Current Meds  Medication Sig   calcium  carbonate (TUMS - DOSED IN MG ELEMENTAL CALCIUM ) 500 MG chewable tablet Chew 2 tablets by mouth 3 (three) times daily as needed for indigestion or heartburn.   metoprolol tartrate (LOPRESSOR) 100 MG tablet Take 100 mg 2 hours before Coronary CT   telmisartan -hydrochlorothiazide  (MICARDIS  HCT) 80-25 MG tablet Take 1 tablet by mouth daily.     Allergies:   Penicillins   Social History   Socioeconomic History   Marital status: Married    Spouse name: Not on file   Number of children: 1   Years of education: Not on file   Highest education level: Not on file  Occupational History   Not on file  Tobacco Use   Smoking status: Never   Smokeless tobacco: Never  Vaping Use   Vaping status: Never Used  Substance and Sexual Activity   Alcohol use: Not Currently   Drug use: No   Sexual activity: Yes  Other Topics Concern   Not on file  Social History Narrative   Dietitian.    Social Drivers of Corporate investment banker Strain: Low Risk  (05/28/2023)   Received from [facility]   Overall Financial Resource Strain (CARDIA)    Difficulty of Paying Living Expenses:  Not hard at all  Food Insecurity: No Food Insecurity (06/18/2023)   Hunger Vital Sign    Worried About Running Out of Food in the Last Year: Never true    Ran Out of Food in the Last Year: Never true  Transportation Needs: No Transportation Needs (06/18/2023)   PRAPARE - Administrator, Civil Service (Medical): No    Lack of Transportation (Non-Medical): No  Physical Activity: Insufficiently Active (05/28/2023)   [facility] from [facility]   Exercise Vital Sign    Days of Exercise per Week: 2 days    Minutes of Exercise per Session: 30 min  Stress: Stress Concern Present (05/28/2023)   Received from [facility] of Occupational Health - Occupational Stress Questionnaire    Feeling of  Stress : Very much  Social Connections: Socially Integrated (06/18/2023)   Social Connection and Isolation Panel [NHANES]    Frequency of Communication with Friends and Family: More than three times a week    Frequency of Social Gatherings with Friends and Family: More than three times a week    Attends Religious Services: More than 4 times per year    Active Member of [employer] or Organizations: Yes    Attends Engineer, structural: More than 4 times per year    Marital Status: Married     Family History: The patient's family history includes Alzheimer's disease in his mother; Heart attack (age of onset: 59) in his father; Heart disease in his father; Multiple sclerosis in his sister; Schizophrenia in his sister.  ROS:   Please see the history of present illness.     All other systems reviewed and are negative.  EKGs/Labs/Other Studies Reviewed:    The following studies were reviewed today: EKG Interpretation Date/Time:  Friday Aug 23 2023 11:32:17 EDT Ventricular Rate:  72 PR Interval:  138 QRS Duration:  74 QT Interval:  398 QTC Calculation: 435 R Axis:   58  Text Interpretation: Normal sinus rhythm Normal ECG When compared with ECG of 17-Jun-2023 10:17, No significant change was found Confirmed by [place], Al Bracewell [id number]) on 08/23/2023 11:38:17 AM   EKG Interpretation Date/Time:  Friday Aug 23 2023 11:32:17 EDT Ventricular Rate:  72 PR Interval:  138 QRS Duration:  74 QT Interval:  398 QTC Calculation: 435 R Axis:   58  Text Interpretation: Normal sinus rhythm Normal ECG When compared with ECG of 17-Jun-2023 10:17, No significant change was found Confirmed by [place], Antoinett Dorman [phone]) on 08/23/2023 11:38:17 AM    Recent Labs: 06/17/2023: ALT 18; BUN 24; Creatinine, Ser 1.20; Hemoglobin 12.8; Platelets 204; Potassium 4.1; Sodium 133  Recent Lipid Panel No results found for: "CHOL", "TRIG", "HDL", "CHOLHDL", "VLDL", "LDLCALC", "LDLDIRECT"   Risk Assessment/Calculations:                 Physical Exam:    VS:  BP 118/68   Pulse 72   Ht 6\' 2"  (1.88 m)   Wt 240 lb (108.9 kg)   SpO2 99%   BMI 30.81 kg/m     Wt Readings from Last 3 Encounters:  08/23/23 240 lb (108.9 kg)  06/17/23 235 lb (106.6 kg)  05/15/19 238 lb 1.6 oz (108 kg)     GEN:  Well nourished, well developed in no acute distress HEENT: Normal NECK: No JVD; No carotid bruits LYMPHATICS: No lymphadenopathy CARDIAC: RRR, no murmurs, rubs, gallops RESPIRATORY:  Clear to auscultation without rales, wheezing or rhonchi  ABDOMEN: Soft, non-tender,  non-distended MUSCULOSKELETAL:  No edema; No deformity  SKIN: Warm and dry NEUROLOGIC:  Alert and oriented x 3 PSYCHIATRIC:  Normal affect   ASSESSMENT:    1. Palpitations   2. Dyspnea on exertion   3. Chest pain of uncertain etiology   4. Primary hypertension   5. Hypercholesterolemia    PLAN:    In order of problems listed above:  Palpitations intermittent. Currently doing pretty well. Discussed avoidance of stimulants like caffeine. If symptoms become more frequent could order event monitor but currently yield would be low. Discussed getting a HR or rhythm monitor such as Kardiomobile. Chest pain and dyspnea. Risk factors of HTN, HLD and family history. Recommend coronary CTA. If he does have CAD will press for more aggressive lipids lowering therapy HTN controlled. HLD. Follow up post CTA           Medication Adjustments/Labs and Tests Ordered: Current medicines are reviewed at length with the patient today.  Concerns regarding medicines are outlined above.  Orders Placed This Encounter  Procedures   CT CORONARY MORPH W/CTA COR W/SCORE W/CA W/CM &/OR WO/CM   Basic metabolic panel with GFR   EKG [id number]   Meds ordered this encounter  Medications   metoprolol tartrate (LOPRESSOR) 100 MG tablet    Sig: Take 100 mg 2 hours before Coronary CT    Dispense:  1 tablet    Refill:  0    Patient Instructions  Medication  Instructions:  Continue same medications *If you need a refill on your cardiac medications before your next appointment, please call your pharmacy*  Lab Work: Bmet today  Testing/Procedures: Coronary CT   will be scheduled after approved by insurance    Follow instructions below  Follow-Up: At [facility], you and your health needs are our priority.  As part of our continuing mission to provide you with exceptional heart care, our providers are all part of one team.  This team includes your primary Cardiologist (physician) and Advanced Practice Providers or APPs (Physician Assistants and Nurse Practitioners) who all work together to provide you with the care you need, when you need it.  Your next appointment:  To Be Determined after test    Provider:  Dr.Lawyer Washabaugh     Your cardiac CT will be scheduled at one of the below locations:   [facility] [address] [place], [place] [address] [phone]  OR  [facility] [address] [place], [place] [address] [phone]  OR   [facility] [address] [place], [place] [address] [phone]  OR   [facility] [address] [place], [place] [address] [phone]  OR   [facility]eralene Mom. [facility] and Vascular Tower [address]  [place], [place] [address] Opening July 22, 2023  If scheduled at [facility], please arrive at the [facility] and Children's Entrance (Entrance C2) of [facility] 30 minutes prior to test start time. You can use the FREE valet parking offered at entrance C (encouraged to control the heart rate for the test)  Proceed to the [facility] Radiology Department (first floor) to check-in and test prep.   All radiology patients and guests should use entrance C2 at [address], accessed from [facility], even though the hospital's physical address listed is [address].    If scheduled at the Heart and Vascular Tower at [address], please enter  the parking lot using the [address] entrance and use the FREE valet service at the patient drop-off area. Enter the buidling and check-in with registration on the main floor.  If scheduled at [facility] or [facility], please arrive 15 mins early for check-in and test prep.  There is spacious parking and easy access to the radiology department from the [facility] Heart and Vascular entrance. Please enter here and check-in with the desk attendant.   If scheduled at [facility], please arrive 30 minutes early for check-in and test prep.  Please follow these instructions carefully (unless otherwise directed):  An IV will be required for this test and Nitroglycerin will be given.  Hold all erectile dysfunction medications at least 3 days (72 hrs) prior to test. (Ie viagra, cialis, sildenafil, tadalafil, etc)   On the Night Before the Test: Be sure to Drink plenty of water . Do not consume any caffeinated/decaffeinated beverages or chocolate 12 hours prior to your test. Do not take any antihistamines 12 hours prior to your test.   On the Day of the Test: Drink plenty of water  until 1 hour prior to the test. Do not eat any food 1 hour prior to test. You may take your regular medications prior to the test.  Take metoprolol 100 mg two hours prior to test. If you take Furosemide/Hydrochlorothiazide /Spironolactone/Chlorthalidone         After the Test: Drink plenty of water . After receiving IV contrast, you may experience a mild flushed feeling. This is normal. On occasion, you may experience a mild rash up to 24 hours after the test. This is not dangerous. If this occurs, you can take Benadryl  25 mg, Zyrtec, Claritin, or Allegra and increase your fluid intake. (Patients taking Tikosyn should avoid Benadryl , and may take Zyrtec,  Claritin, or Allegra) If you experience trouble breathing, this can be serious. If it is severe call 911 IMMEDIATELY. If it is mild, please call our office.  We will call to schedule your test 2-4 weeks out understanding that some insurance companies will need an authorization prior to the service being performed.   For more information and frequently asked questions, please visit our website : [email or web address]  For non-scheduling related questions, please contact the cardiac imaging nurse navigator should you have any questions/concerns: Cardiac Imaging Nurse Navigators Direct Office Dial: [phone]   For scheduling needs, including cancellations and rescheduling, please call [place], [phone].   We recommend signing up for the patient portal called "MyChart".  Sign up information is provided on this After Visit Summary.  MyChart is used to connect with patients for Virtual Visits (Telemedicine).  Patients are able to view lab/test results, encounter notes, upcoming appointments, etc.  Non-urgent messages can be sent to your provider as well.   To learn more about what you can do with MyChart, go to [email or web address].     Signed, Annalie Wenner [place], MD  08/23/2023 2:46 PM    [facility]

## 2023-08-23 ENCOUNTER — Ambulatory Visit: Attending: Cardiology | Admitting: Cardiology

## 2023-08-23 ENCOUNTER — Encounter: Payer: Self-pay | Admitting: Cardiology

## 2023-08-23 VITALS — BP 118/68 | HR 72 | Ht 74.0 in | Wt 240.0 lb

## 2023-08-23 DIAGNOSIS — E7800 Pure hypercholesterolemia, unspecified: Secondary | ICD-10-CM

## 2023-08-23 DIAGNOSIS — I10 Essential (primary) hypertension: Secondary | ICD-10-CM

## 2023-08-23 DIAGNOSIS — R0609 Other forms of dyspnea: Secondary | ICD-10-CM | POA: Diagnosis not present

## 2023-08-23 DIAGNOSIS — R002 Palpitations: Secondary | ICD-10-CM

## 2023-08-23 DIAGNOSIS — R079 Chest pain, unspecified: Secondary | ICD-10-CM | POA: Diagnosis not present

## 2023-08-23 MED ORDER — METOPROLOL TARTRATE 100 MG PO TABS: ORAL_TABLET | ORAL | 0 refills | Status: AC

## 2023-08-23 NOTE — Patient Instructions (Addendum)
 Medication Instructions:  Continue same medications *If you need a refill on your cardiac medications before your next appointment, please call your pharmacy*  Lab Work: Bmet today  Testing/Procedures: Coronary CT   will be scheduled after approved by insurance    Follow instructions below  Follow-Up: At [facility], you and your health needs are our priority.  As part of our continuing mission to provide you with exceptional heart care, our providers are all part of one team.  This team includes your primary Cardiologist (physician) and Advanced Practice Providers or APPs (Physician Assistants and Nurse Practitioners) who all work together to provide you with the care you need, when you need it.  Your next appointment:  To Be Determined after test    Provider:  Dr.Jordan     Your cardiac CT will be scheduled at one of the below locations:   [facility] [address] [place], [place] [address] [phone]  OR  [facility] [address] [place], [place] [address] [phone]  OR   [facility] [address] [place], [place] [address] [phone]  OR   [facility] [address] [place], [place] [address] [phone]  OR   [facility] and Vascular Tower [address]  [place], [place] [address] Opening July 22, 2023  If scheduled at [facility], please arrive at the [facility] and Children's Entrance (Entrance C2) of [facility] 30 minutes prior to test start time. You can use the FREE valet parking offered at entrance C (encouraged to control the heart rate for the test)  Proceed to the [facility] Radiology Department (first floor) to check-in and test prep.   All radiology patients and guests should use entrance C2 at [facility], accessed from [facility], even though the hospital's physical address  listed is [address].    If scheduled at the Heart and Vascular Tower at [address], please enter the parking lot using the [address] entrance and use the FREE valet service at the patient drop-off area. Enter the buidling and check-in with registration on the main floor.  If scheduled at [facility] or [facility], please arrive 15 mins early for check-in and test prep.  There is spacious parking and easy access to the radiology department from the [facility] Heart and Vascular entrance. Please enter here and check-in with the desk attendant.   If scheduled at [facility], please arrive 30 minutes early for check-in and test prep.  Please follow these instructions carefully (unless otherwise directed):  An IV will be required for this test and Nitroglycerin will be given.  Hold all erectile dysfunction medications at least 3 days (72 hrs) prior to test. (Ie viagra, cialis, sildenafil, tadalafil, etc)   On the Night Before the Test: Be sure to Drink plenty of water . Do not consume any caffeinated/decaffeinated beverages or chocolate 12 hours prior to your test. Do not take any antihistamines 12 hours prior to your test.   On the Day of the Test: Drink plenty of water  until 1 hour prior to the test. Do not eat any food 1 hour prior to test. You may take your regular medications prior to the test.  Take metoprolol 100 mg two hours prior to test. If you take Furosemide/Hydrochlorothiazide /Spironolactone/Chlorthalidone         After the Test: Drink plenty of  water . After receiving IV contrast, you may experience a mild flushed feeling. This is normal. On occasion, you may experience a mild rash up to 24 hours after the test. This is not dangerous. If this occurs, you can take Benadryl  25 mg, Zyrtec, Claritin, or Allegra and increase your fluid intake. (Patients taking Tikosyn should avoid Benadryl , and may take  Zyrtec, Claritin, or Allegra) If you experience trouble breathing, this can be serious. If it is severe call 911 IMMEDIATELY. If it is mild, please call our office.  We will call to schedule your test 2-4 weeks out understanding that some insurance companies will need an authorization prior to the service being performed.   For more information and frequently asked questions, please visit our website : [email or web address]  For non-scheduling related questions, please contact the cardiac imaging nurse navigator should you have any questions/concerns: Cardiac Imaging Nurse Navigators Direct Office Dial: [phone]   For scheduling needs, including cancellations and rescheduling, please call [place], [phone].   We recommend signing up for the patient portal called "MyChart".  Sign up information is provided on this After Visit Summary.  MyChart is used to connect with patients for Virtual Visits (Telemedicine).  Patients are able to view lab/test results, encounter notes, upcoming appointments, etc.  Non-urgent messages can be sent to your provider as well.   To learn more about what you can do with MyChart, go to [email or web address].

## 2023-08-24 LAB — BASIC METABOLIC PANEL WITH GFR
BUN/Creatinine Ratio: 16 (ref 10–24)
BUN: 16 mg/dL (ref 8–27)
CO2: 23 mmol/L (ref 20–29)
Calcium: 9 mg/dL (ref 8.6–10.2)
Chloride: 102 mmol/L (ref 96–106)
Creatinine, Ser: 0.99 mg/dL (ref 0.76–1.27)
Glucose: 93 mg/dL (ref 70–99)
Potassium: 4.4 mmol/L (ref 3.5–5.2)
Sodium: 140 mmol/L (ref 134–144)
eGFR: 83 mL/min/{1.73_m2} (ref >59)

## 2023-08-25 ENCOUNTER — Ambulatory Visit: Payer: Self-pay | Admitting: Cardiology

## 2023-09-10 ENCOUNTER — Telehealth (HOSPITAL_COMMUNITY): Payer: Self-pay | Admitting: Emergency Medicine

## 2023-09-10 NOTE — Telephone Encounter (Signed)
 Reaching out to patient to offer assistance regarding upcoming cardiac imaging study; pt verbalizes understanding of appt date/time, parking situation and where to check in, pre-test NPO status and medications ordered, and verified current allergies; name and call back number provided for further questions should they arise Rockwell Alexandria RN Navigator Cardiac Imaging [facility] [phone] office [phone] cell

## 2023-09-11 ENCOUNTER — Ambulatory Visit (HOSPITAL_COMMUNITY)
Admission: RE | Admit: 2023-09-11 | Discharge: 2023-09-11 | Disposition: A | Discharge status: Home / Self Care | Source: Ambulatory Visit | Attending: Cardiology | Admitting: Cardiology

## 2023-09-11 DIAGNOSIS — R0609 Other forms of dyspnea: Secondary | ICD-10-CM | POA: Diagnosis not present

## 2023-09-11 DIAGNOSIS — I2510 Atherosclerotic heart disease of native coronary artery without angina pectoris: Secondary | ICD-10-CM

## 2023-09-11 DIAGNOSIS — R002 Palpitations: Secondary | ICD-10-CM | POA: Diagnosis not present

## 2023-09-11 DIAGNOSIS — I10 Essential (primary) hypertension: Secondary | ICD-10-CM | POA: Diagnosis not present

## 2023-09-11 DIAGNOSIS — E7800 Pure hypercholesterolemia, unspecified: Secondary | ICD-10-CM | POA: Diagnosis not present

## 2023-09-11 DIAGNOSIS — R079 Chest pain, unspecified: Secondary | ICD-10-CM | POA: Diagnosis present

## 2023-09-11 MED ORDER — IOHEXOL 350 MG/ML SOLN
100.0000 mL | Freq: Once | INTRAVENOUS | Status: AC | PRN
  Administered 2023-09-11: 100 mL via INTRAVENOUS

## 2023-09-11 MED ORDER — NITROGLYCERIN 0.4 MG SL SUBL
0.8000 mg | SUBLINGUAL_TABLET | Freq: Once | SUBLINGUAL | Status: AC
  Administered 2023-09-11: 0.8 mg via SUBLINGUAL

## 2023-09-12 ENCOUNTER — Ambulatory Visit: Admitting: Orthopedic Surgery

## 2023-09-12 ENCOUNTER — Other Ambulatory Visit (INDEPENDENT_AMBULATORY_CARE_PROVIDER_SITE_OTHER): Payer: Self-pay

## 2023-09-12 DIAGNOSIS — S82841A Displaced bimalleolar fracture of right lower leg, initial encounter for closed fracture: Secondary | ICD-10-CM

## 2023-09-12 NOTE — Progress Notes (Signed)
 Orthopedic Surgery Post-operative Office Visit   Procedure: right bimalleolar ankle fracture ORIF Date of Surgery: 06/17/2023 (~3 months post-op)   Assessment: Patient is a 68 y.o. who is doing well after surgery     Plan: -Operative plans complete -Weight bearing status: WBAT RLE in regular shoes -Pain management: tylenol  as needed -Return to office in 3 months, x-rays needed at next visit: weight bearing AP/lateral/mortise right ankle   ___________________________________________________________________________     Subjective: Patient is not having any ankle pain at this point.  He said he is returned to all of his activities without issue.  He is weightbearing in regular shoes.  He is pleased with how he is doing at this point.   Objective:   General: no acute distress, appropriate affect Neurologic: alert, answering questions appropriately, following commands Respiratory: unlabored breathing on room air Skin: incision is well healed   MSK (spine): EHL/TA/GSC intact, dorsiflexion to neutral, plantarflexion to 30 degrees, sensation intact to light touch in deep peroneal/superficial peroneal/tibial/saphenous/sural nerve distributions, palpable DP pulse, foot warm and well perfused   Imaging: X-rays of the right ankle from 09/12/2023 were independently reviewed and interpreted, showing fixation with plate and screws over the lateral malleolus.  No lucency seen around the screws.  Fracture reduction and ankle alignment appear maintained since prior films on 07/31/2023.  There is a suture button over the medial aspect of the distal tibia.  No new fracture seen.     Patient name: Johnny Gonzalez Patient MRN: [id number] Date of visit: 09/12/23

## 2023-09-13 ENCOUNTER — Other Ambulatory Visit: Payer: Self-pay

## 2023-09-13 DIAGNOSIS — E7800 Pure hypercholesterolemia, unspecified: Secondary | ICD-10-CM

## 2023-09-13 MED ORDER — ROSUVASTATIN CALCIUM 10 MG PO TABS: 10.0000 mg | ORAL_TABLET | Freq: Every day | ORAL | 6 refills | Status: AC

## 2023-09-23 ENCOUNTER — Telehealth: Payer: Self-pay

## 2023-09-23 NOTE — Telephone Encounter (Signed)
 Spoke to patient Dr.Jordan advised to follow up with him as needed.

## 2024-01-01 ENCOUNTER — Ambulatory Visit: Admitting: Orthopedic Surgery

## 2024-01-01 ENCOUNTER — Other Ambulatory Visit (INDEPENDENT_AMBULATORY_CARE_PROVIDER_SITE_OTHER)

## 2024-01-01 DIAGNOSIS — S82841A Displaced bimalleolar fracture of right lower leg, initial encounter for closed fracture: Secondary | ICD-10-CM

## 2024-01-01 NOTE — Progress Notes (Signed)
 Orthopedic Surgery Post-operative Office Visit   Procedure: right bimalleolar ankle fracture ORIF Date of Surgery: 06/17/2023 (~6 months post-op)   Assessment: Patient is a 68 y.o. who is doing well after surgery     Plan: -Operative plans complete -Weight bearing status: WBAT RLE in regular shoes -Pain management: tylenol  as needed -Return to office in 6 months, x-rays needed at next visit: weight bearing AP/lateral/mortise right ankle   ___________________________________________________________________________     Subjective: Patient has been doing well since he was last in the office.  He is not having any ankle pain.  He is weightbearing in regular shoes.  He is not taking any medication for pain.   Objective:   General: no acute distress, appropriate affect Neurologic: alert, answering questions appropriately, following commands Respiratory: unlabored breathing on room air Skin: incision is well healed   MSK (spine): EHL/TA/GSC intact, dorsiflexion to neutral, plantarflexion to 30 degrees, sensation intact to light touch in deep peroneal/superficial peroneal/tibial/saphenous/sural nerve distributions, palpable DP pulse, foot warm and well perfused   Imaging: XRs of the right ankle from 01/01/2024 were independently reviewed and interpreted, showing lateral plate fixation over the distal fibula.  No lucency seen around the screws.  No fracture line seen.  Suture button spanning across the syndesmosis with the button on the medial aspect of the distal tibia.  No new fracture seen.     Patient name: Johnny Gonzalez Patient MRN: [id number] Date of visit: 01/01/24   Pre-operative Scores   VAS leg: 8/10  6 Month Postoperative Score  VAS leg: 0/10

## 2024-07-01 ENCOUNTER — Ambulatory Visit: Admitting: Orthopedic Surgery
